# Patient Record
Sex: Male | Born: 1980
Health system: Southern US, Community
[De-identification: ages and names within clinical notes are randomized; demographics above are authoritative.]

## PROBLEM LIST (undated history)

## (undated) DIAGNOSIS — M064 Inflammatory polyarthropathy: Secondary | ICD-10-CM

## (undated) DIAGNOSIS — M109 Gout, unspecified: Secondary | ICD-10-CM

## (undated) DIAGNOSIS — E785 Hyperlipidemia, unspecified: Secondary | ICD-10-CM

## (undated) DIAGNOSIS — K219 Gastro-esophageal reflux disease without esophagitis: Secondary | ICD-10-CM

## (undated) HISTORY — PX: KNEE SURGERY: SHX244

## (undated) HISTORY — DX: Hyperlipidemia, unspecified: E78.5

## (undated) HISTORY — DX: Gastro-esophageal reflux disease without esophagitis: K21.9

## (undated) HISTORY — PX: VASECTOMY: SHX75

## (undated) HISTORY — DX: Inflammatory polyarthropathy: M06.4

## (undated) HISTORY — PX: TOE SURGERY: SHX1073

---

## 2006-04-19 ENCOUNTER — Ambulatory Visit (HOSPITAL_COMMUNITY): Admission: RE | Admit: 2006-04-19 | Discharge: 2006-04-19 | Payer: Self-pay | Admitting: Orthopedic Surgery

## 2006-05-17 ENCOUNTER — Ambulatory Visit (HOSPITAL_COMMUNITY): Admission: RE | Admit: 2006-05-17 | Discharge: 2006-05-17 | Payer: Self-pay | Admitting: Orthopedic Surgery

## 2006-09-26 ENCOUNTER — Ambulatory Visit: Payer: Self-pay | Admitting: Internal Medicine

## 2006-10-01 ENCOUNTER — Ambulatory Visit: Payer: Self-pay | Admitting: Internal Medicine

## 2006-10-01 LAB — CONVERTED CEMR LAB
Bilirubin Urine: NEGATIVE
CO2: 30 meq/L (ref 19–32)
Calcium: 9 mg/dL (ref 8.4–10.5)
Chloride: 106 meq/L (ref 96–112)
Cholesterol: 172 mg/dL (ref 0–200)
Crystals: NEGATIVE
Hemoglobin, Urine: NEGATIVE
Leukocytes, UA: NEGATIVE
Nitrite: NEGATIVE
RBC / HPF: NONE SEEN
Total Protein, Urine: NEGATIVE mg/dL
Triglyceride fasting, serum: 176 mg/dL — ABNORMAL HIGH (ref 0–149)
Urine Glucose: NEGATIVE mg/dL
Urobilinogen, UA: 0.2 (ref 0.0–1.0)
VLDL: 35 mg/dL (ref 0–40)
pH: 5.5 (ref 5.0–8.0)

## 2007-01-20 ENCOUNTER — Ambulatory Visit: Payer: Self-pay | Admitting: Internal Medicine

## 2007-01-21 ENCOUNTER — Encounter: Payer: Self-pay | Admitting: Internal Medicine

## 2007-01-31 ENCOUNTER — Ambulatory Visit: Payer: Self-pay | Admitting: Internal Medicine

## 2007-01-31 LAB — CONVERTED CEMR LAB
H Pylori IgG: NEGATIVE
Tissue Transglutaminase Ab, IgA: <3 units (ref <5)

## 2007-02-14 ENCOUNTER — Ambulatory Visit: Payer: Self-pay | Admitting: Internal Medicine

## 2007-02-27 ENCOUNTER — Ambulatory Visit: Payer: Self-pay | Admitting: Internal Medicine

## 2007-03-06 ENCOUNTER — Encounter: Payer: Self-pay | Admitting: Internal Medicine

## 2007-03-06 ENCOUNTER — Ambulatory Visit: Payer: Self-pay | Admitting: Internal Medicine

## 2007-03-25 ENCOUNTER — Ambulatory Visit: Payer: Self-pay | Admitting: Internal Medicine

## 2007-03-25 LAB — CONVERTED CEMR LAB
AST: 27 units/L (ref 0–37)
Basophils Absolute: 0 10*3/uL (ref 0.0–0.1)
Eosinophils Absolute: 0.1 10*3/uL (ref 0.0–0.6)
Eosinophils Relative: 0.7 % (ref 0.0–5.0)
HCT: 44 % (ref 39.0–52.0)
Lymphocytes Relative: 17.4 % (ref 12.0–46.0)
MCHC: 34.6 g/dL (ref 30.0–36.0)
Mono Screen: NEGATIVE
Neutrophils Relative %: 71.9 % (ref 43.0–77.0)
RBC: 5.42 M/uL (ref 4.22–5.81)
WBC: 11.7 10*3/uL — ABNORMAL HIGH (ref 4.5–10.5)

## 2007-06-06 DIAGNOSIS — K589 Irritable bowel syndrome without diarrhea: Secondary | ICD-10-CM | POA: Insufficient documentation

## 2007-06-06 DIAGNOSIS — J309 Allergic rhinitis, unspecified: Secondary | ICD-10-CM | POA: Insufficient documentation

## 2007-06-06 DIAGNOSIS — F988 Other specified behavioral and emotional disorders with onset usually occurring in childhood and adolescence: Secondary | ICD-10-CM | POA: Insufficient documentation

## 2007-06-06 DIAGNOSIS — K219 Gastro-esophageal reflux disease without esophagitis: Secondary | ICD-10-CM

## 2007-06-06 DIAGNOSIS — L0292 Furuncle, unspecified: Secondary | ICD-10-CM | POA: Insufficient documentation

## 2007-06-06 DIAGNOSIS — L2089 Other atopic dermatitis: Secondary | ICD-10-CM

## 2007-06-06 DIAGNOSIS — L0293 Carbuncle, unspecified: Secondary | ICD-10-CM

## 2007-06-06 DIAGNOSIS — F329 Major depressive disorder, single episode, unspecified: Secondary | ICD-10-CM

## 2009-12-29 ENCOUNTER — Ambulatory Visit (HOSPITAL_BASED_OUTPATIENT_CLINIC_OR_DEPARTMENT_OTHER): Admission: RE | Admit: 2009-12-29 | Discharge: 2009-12-29 | Payer: Self-pay | Admitting: Otolaryngology

## 2010-02-06 ENCOUNTER — Ambulatory Visit (HOSPITAL_BASED_OUTPATIENT_CLINIC_OR_DEPARTMENT_OTHER): Admission: RE | Admit: 2010-02-06 | Discharge: 2010-02-06 | Payer: Self-pay | Admitting: Otolaryngology

## 2010-02-11 ENCOUNTER — Ambulatory Visit: Payer: Self-pay | Admitting: Internal Medicine

## 2010-03-04 ENCOUNTER — Encounter: Payer: Self-pay | Admitting: Internal Medicine

## 2010-05-12 ENCOUNTER — Encounter: Payer: Self-pay | Admitting: Internal Medicine

## 2010-05-29 ENCOUNTER — Encounter: Payer: Self-pay | Admitting: Internal Medicine

## 2010-11-28 NOTE — Letter (Signed)
Summary: CMN for CPAP Supplies/[facility]  CMN for CPAP Supplies/[facility]   Imported By: Sherian Rein 03/10/2010 10:05:34  _____________________________________________________________________  External Attachment:    Type:   Image     Comment:   External Document

## 2010-11-28 NOTE — Letter (Signed)
Summary: CMN/[facility]  CMN/[facility]   Imported By: [place] 06/02/2010 14:24:12  _____________________________________________________________________  External Attachment:    Type:   Image     Comment:   External Document

## 2010-11-28 NOTE — Letter (Signed)
Summary: CMN for CPAP Supplies/[facility]  CMN for CPAP Supplies/[facility]   Imported By: Sherian Rein 05/17/2010 07:38:20  _____________________________________________________________________  External Attachment:    Type:   Image     Comment:   External Document

## 2011-03-13 NOTE — Assessment & Plan Note (Signed)
[facility]                         GASTROENTEROLOGY OFFICE NOTE   NAME:FUHRMANGagan, Dillion                      MRN:          [id number]  DATE:02/27/2007                            DOB:          14-Jan-1981    CHIEF COMPLAINT:  Dr. Artist Pais wants me to have an endoscopy to look for  sprue.   ASSESSMENT:  A 30 year old white man with chronic recurrent diarrhea and  crampy abdominal pain.  He has a lot of urgent defecation.  There may  have been some rectal bleeding.  He has a positive antigliadin antibody,  IgG.  This could indicate sprue.   PLAN:  1. Schedule upper GI endoscopy with duodenal biopsy.  2. Pending that, consider symptomatic treatment for irritable bowel      syndrome, which is certainly quite likely.  3. He could need a flexible sigmoidoscopy or other investigation as      well.  4. He also has complaints of gastroesophageal reflux disease, but he      is currently on ranitidine versus omeprazole, he cannot remember      which or both, and seems to be okay.  He will clarify which      medication he is taking.   HISTORY:  See my medical history form for full details.  Other notables  are that his weight and appetite seem to fluctuate.   CURRENT MEDICATIONS:  He is either on omeprazole 20 mg daily or  ranitidine 150 mg or both.  Wellbutrin XL 300 mg daily, Xyzal 5 mg at  bedtime, minocycline 50 mg twice daily.   DRUG ALLERGIES:  None known.   PAST MEDICAL HISTORY:  1. Acne vulgaris.  2. Left toe surgery.  3. Gastroesophageal reflux disease.  4. History of substance abuse as a teenager.  5. Depression.  6. Atopic dermatitis.  7. History of attention-deficit hyperactivity disorder.   SOCIAL HISTORY:  He is married.  Two children.  Moved here from [place]  a couple of years ago.  He is here with his mother-in-law today.  No  alcohol, tobacco, or drug use.   FAMILY HISTORY:  Noncontributory.  See medical history form.   REVIEW OF  SYSTEMS:  See my medical history form.   PHYSICAL EXAMINATION:  VITAL SIGNS:  Height 6 feet 1.  Weight 276  pounds.  Blood pressure 120/82, pulse 80.  GENERAL:  A well-developed, obese white male in no acute distress.  HEENT:  Eyes:  Anicteric.  ENT:  Normal mouth, pharynx.  NECK:  Supple.  No thyromegaly.  CHEST:  Clear.  HEART:  S1 and S2.  No murmurs, rubs or gallops.  ABDOMEN:  Obese, soft and nontender without organomegaly or mass.  SKIN:  He had some changes of acne on the face and torso.   I appreciate the opportunity to care for this patient.  I have reviewed  the records in the chart from Dr. Artist Pais.     Iva Boop, MD,FACG  Electronically Signed    CEG/MedQ  DD: 02/28/2007  DT: 02/28/2007  Job #: [id number]   cc:   Barbette Hair. Artist Pais,  DO

## 2011-03-16 NOTE — Assessment & Plan Note (Signed)
[facility]                           PRIMARY CARE OFFICE NOTE   NAME:FUHRMANEdras, Thomas Schaefer                      MRN:          [id number]  DATE:09/26/2006                            DOB:          05-07-1981    CHIEF COMPLAINT:  New patient to practice.   HISTORY OF PRESENT ILLNESS:  The patient is a 29 year old white male  here to establish primary care.  He was formerly followed by [facility], Dr. Jeannetta Nap.  The patient states that he has a  past medical history of ADHD as well as depression.  He was seeing a  psychiatrist in [place] before he moved to this area.  He does not  recall which medications he was on.  It may have been Wellbutrin.  He  states that he often forgot to take his medications.  He does not have  any issues with depression at this time.  He is married, works as a  Advice worker, has been functioning well.   His other history is significant for gastroesophageal reflux disease and  remote history of alcohol/recreational drug use.   CURRENT MEDICATIONS:  None.   ALLERGIES TO MEDICATIONS:  None known.   PAST MEDICAL HISTORY SUMMARY:  1. ADHD.  2. Depression.  3. History of alcohol abuse.  4. Gastroesophageal reflux disease.   SOCIAL HISTORY:  The patient is married.  He has a daughter that is 69  years old and another child on the way.  It has been approximately 2  years since he moved from [place].   FAMILY HISTORY:  Mother is alive at age 89, has type 2 diabetes.  She  has bipolar disorder.  Father is alive, approximately age 99, has  hypertension and hyperlipidemia.  Emelia Loron is known to have heart  disease at age 37.  Denies any family history of colon cancer or  prostate cancer.   HABITS:  No current alcohol or tobacco use.  Quit tobacco in 2005.  He  smoked for approximately 13 years, one pack per day.  When he was  drinking between the ages of 56 and 22 he states that he was drinking  daily,  mainly beer.  There is also a remote history of cannabis use and  acid use.   REVIEW OF SYSTEMS:  No fevers, chills.  The patient's wife has been  concerned regarding possible hearing loss.  He normally listens to the  TV at a high volume.  Also sometimes has difficulty discerning normal  conversation.  He denies any chest pain, shortness of breath.  Heartburn  is mainly with spicy foods.  Denies any constipation or diarrhea.  No  dark stools or blood in his stool.  All other systems negative.   PHYSICAL EXAMINATION:  VITAL SIGNS:  Height is 6 feet 4 inches, weight  is 272 pounds, temperature is 97.9, pulse is 64, and BP is 127/74 in the  left arm in a seated position.  GENERAL:  The patient is a well-developed, well-nourished, pleasant 21-  year-old white male in no apparent distress.  HEENT:  Normocephalic, atraumatic.  Pupils are equal and reactive to  light bilaterally.  Extraocular motility was intact.  The patient was  anicteric.  Conjunctivae were within normal limits.  The patient's  oropharynx was unremarkable.  External auditory canals and tympanic  membranes are clear bilaterally.  The patient had diminished hearing on  his right side to finger-rub and Rhine/Weber test was positive also on  the right.  NECK:  Supple.  No adenopathy, carotid bruit or thyromegaly.  CHEST:  Normal respiratory effort.  Chest was clear to auscultation  bilaterally.  No rhonchi, rales or wheezing.  CARDIOVASCULAR:  Regular rate and rhythm.  No significant murmurs, rubs  or gallops appreciated.  ABDOMEN:  Soft, nontender, positive bowel sounds, no organomegaly.  MUSCULOSKELETAL:  No clubbing, cyanosis, or edema.  SKIN:  Warm and dry.  He did have some cystic acne on the face and back.  NEUROLOGIC:  Cranial nerves II-XII were grossly intact.  He was  nonfocal.   IMPRESSION/RECOMMENDATIONS:  1. Hearing loss, conductive versus sensorineural.  2. History of attention deficit hyperactivity  disorder.  3. History of depression.  4. Health maintenance.   RECOMMENDATIONS:  He does not have any etiology from his employment in  terms of exposure to loud noises, but he does seem to have gross hearing  loss, particularly on the right side.  He will be referred to an  audiologist for further testing.   We will try to obtain records from his psychiatrist in [place].  At this  time the patient seems to be highly functional.  We will further discuss  whether we need to restart antidepressants on followup visit.   With family history of coronary artery disease, the patient will be sent  for routine labs within the next 1-2 week time period and a fasting  blood sugar will also be checked.  Followup time is in approximately 6  weeks.     Barbette Hair. Artist Pais, DO  Electronically Signed    RDY/MedQ  DD: 09/27/2006  DT: 09/27/2006  Job #: [id number]

## 2011-06-26 ENCOUNTER — Emergency Department (HOSPITAL_COMMUNITY)
Admission: EM | Admit: 2011-06-26 | Discharge: 2011-06-26 | Disposition: A | Discharge status: Home / Self Care | Payer: Medicaid Other | Attending: Emergency Medicine | Admitting: Emergency Medicine

## 2011-06-26 ENCOUNTER — Emergency Department (HOSPITAL_COMMUNITY): Payer: Medicaid Other

## 2011-06-26 DIAGNOSIS — S838X9A Sprain of other specified parts of unspecified knee, initial encounter: Secondary | ICD-10-CM | POA: Insufficient documentation

## 2011-06-26 DIAGNOSIS — M25569 Pain in unspecified knee: Secondary | ICD-10-CM | POA: Insufficient documentation

## 2011-06-26 DIAGNOSIS — W010XXA Fall on same level from slipping, tripping and stumbling without subsequent striking against object, initial encounter: Secondary | ICD-10-CM | POA: Insufficient documentation

## 2011-06-26 DIAGNOSIS — S86819A Strain of other muscle(s) and tendon(s) at lower leg level, unspecified leg, initial encounter: Secondary | ICD-10-CM | POA: Insufficient documentation

## 2012-08-13 ENCOUNTER — Ambulatory Visit
Admission: RE | Admit: 2012-08-13 | Discharge: 2012-08-13 | Disposition: A | Discharge status: Home / Self Care | Payer: BC Managed Care – PPO | Source: Ambulatory Visit | Attending: Chiropractor | Admitting: Chiropractor

## 2012-08-13 ENCOUNTER — Other Ambulatory Visit: Payer: Self-pay | Admitting: Chiropractor

## 2012-08-13 DIAGNOSIS — M545 Low back pain: Secondary | ICD-10-CM

## 2015-06-24 ENCOUNTER — Ambulatory Visit: Payer: Self-pay | Admitting: Internal Medicine

## 2015-07-11 ENCOUNTER — Ambulatory Visit (INDEPENDENT_AMBULATORY_CARE_PROVIDER_SITE_OTHER): Payer: Commercial Managed Care - HMO | Admitting: Internal Medicine

## 2015-07-11 ENCOUNTER — Other Ambulatory Visit (INDEPENDENT_AMBULATORY_CARE_PROVIDER_SITE_OTHER): Payer: Commercial Managed Care - HMO

## 2015-07-11 ENCOUNTER — Encounter: Payer: Self-pay | Admitting: Internal Medicine

## 2015-07-11 VITALS — BP 110/62 | HR 68 | Temp 98.4°F | Resp 16 | Ht 73.0 in | Wt 297.0 lb

## 2015-07-11 DIAGNOSIS — K589 Irritable bowel syndrome without diarrhea: Secondary | ICD-10-CM

## 2015-07-11 DIAGNOSIS — E785 Hyperlipidemia, unspecified: Secondary | ICD-10-CM | POA: Diagnosis not present

## 2015-07-11 DIAGNOSIS — F39 Unspecified mood [affective] disorder: Secondary | ICD-10-CM

## 2015-07-11 DIAGNOSIS — E669 Obesity, unspecified: Secondary | ICD-10-CM

## 2015-07-11 LAB — COMPREHENSIVE METABOLIC PANEL
ALK PHOS: 64 U/L (ref 39–117)
ALT: 32 U/L (ref 0–53)
AST: 26 U/L (ref 0–37)
Albumin: 4 g/dL (ref 3.5–5.2)
BUN: 9 mg/dL (ref 6–23)
CHLORIDE: 104 meq/L (ref 96–112)
CO2: 29 mEq/L (ref 19–32)
Calcium: 9.1 mg/dL (ref 8.4–10.5)
Creatinine, Ser: 0.99 mg/dL (ref 0.40–1.50)
GFR: 91.92 mL/min (ref >60.00)
Glucose, Bld: 83 mg/dL (ref 70–99)
POTASSIUM: 4 meq/L (ref 3.5–5.1)
Sodium: 140 mEq/L (ref 135–145)
Total Bilirubin: 0.7 mg/dL (ref 0.2–1.2)
Total Protein: 6.9 g/dL (ref 6.0–8.3)

## 2015-07-11 LAB — LIPID PANEL
CHOLESTEROL: 184 mg/dL (ref 0–200)
HDL: 31.4 mg/dL — ABNORMAL LOW (ref >39.00)
LDL CALC: 117 mg/dL — AB (ref 0–99)
NonHDL: 152.77
TRIGLYCERIDES: 179 mg/dL — AB (ref 0.0–149.0)
Total CHOL/HDL Ratio: 6
VLDL: 35.8 mg/dL (ref 0.0–40.0)

## 2015-07-11 LAB — CBC
HEMATOCRIT: 44.1 % (ref 39.0–52.0)
HEMOGLOBIN: 15.1 g/dL (ref 13.0–17.0)
MCHC: 34.3 g/dL (ref 30.0–36.0)
MCV: 83.3 fl (ref 78.0–100.0)
Platelets: 218 10*3/uL (ref 150.0–400.0)
RBC: 5.3 Mil/uL (ref 4.22–5.81)
RDW: 13 % (ref 11.5–15.5)
WBC: 7 10*3/uL (ref 4.0–10.5)

## 2015-07-11 LAB — TSH: TSH: 2.04 u[IU]/mL (ref 0.35–4.50)

## 2015-07-11 MED ORDER — LOPERAMIDE HCL 2 MG PO TABS: 2.0000 mg | ORAL_TABLET | Freq: Four times a day (QID) | ORAL | Status: DC | PRN

## 2015-07-11 MED ORDER — PHENTERMINE-TOPIRAMATE ER 7.5-46 MG PO CP24: 1.0000 | ORAL_CAPSULE | Freq: Every day | ORAL | Status: DC

## 2015-07-11 MED ORDER — PHENTERMINE-TOPIRAMATE ER 3.75-23 MG PO CP24: 1.0000 | ORAL_CAPSULE | Freq: Every day | ORAL | Status: DC

## 2015-07-11 NOTE — Progress Notes (Signed)
Pre visit review using our clinic review tool, if applicable. No additional management support is needed unless otherwise documented below in the visit note. 

## 2015-07-11 NOTE — Patient Instructions (Addendum)
We have given you the prescriptions for the weight loss medicine. Take 1 pill for 2 weeks of the phentermine/topirimate 3.75/23 mg. Then if you are doing okay increase to the 7.5/46 mg pills (1 pill daily) for 2 weeks. If you are doing well call us and we will send in the next dose which you can stay on for 3 months to see if it works for weight loss. We need to see you back in about 3 months to see how the weight is doing.  For the stomach we would like you to work on increasing fiber in your diet or taking fiber supplement to help with the bowels. You can use imodium to help prevent diarrhea after eating.   For counseling we recommend: [facility] Address: [address], [place], [place] [address] Phone: [phone] or [facility] counseling [address] [place], [place] [address] ph: [phone]  We are checking your labs today and will call you back with the results.   Diet and Irritable Bowel Syndrome  No cure has been found for irritable bowel syndrome (IBS). Many options are available to treat the symptoms. Your caregiver will give you the best treatments available for your symptoms. He or she will also encourage you to manage stress and to make changes to your diet. You need to work with your caregiver and Registered Dietician to find the best combination of medicine, diet, counseling, and support to control your symptoms. The following are some diet suggestions. FOODS THAT MAKE IBS WORSE  Fatty foods, such as [place] fries.  Milk products, such as cheese or ice cream.  Chocolate.  Alcohol.  Caffeine (found in coffee and some sodas).  Carbonated drinks, such as soda. If certain foods cause symptoms, you should eat less of them or stop eating them. FOOD JOURNAL   Keep a journal of the foods that seem to cause distress. Write down:  What you are eating during the day and when.  What problems you are having after eating.  When the symptoms occur in relation to your  meals.  What foods always make you feel badly.  Take your notes with you to your caregiver to see if you should stop eating certain foods. FOODS THAT MAKE IBS BETTER Fiber reduces IBS symptoms, especially constipation, because it makes stools soft, bulky, and easier to pass. Fiber is found in bran, bread, cereal, beans, fruit, and vegetables. Examples of foods with fiber include:  Apples.  Peaches.  Pears.  Berries.  Figs.  Broccoli, raw.  Cabbage.  Carrots.  Raw peas.  Kidney beans.  Lima beans.  Whole-grain bread.  Whole-grain cereal. Add foods with fiber to your diet a little at a time. This will let your body get used to them. Too much fiber at once might cause gas and swelling of your abdomen. This can trigger symptoms in a person with IBS. Caregivers usually recommend a diet with enough fiber to produce soft, painless bowel movements. High fiber diets may cause gas and bloating. However, these symptoms often go away within a few weeks, as your body adjusts. In many cases, dietary fiber may lessen IBS symptoms, particularly constipation. However, it may not help pain or diarrhea. High fiber diets keep the colon mildly enlarged (distended) with the added fiber. This may help prevent spasms in the colon. Some forms of fiber also keep water in the stool, thereby preventing hard stools that are difficult to pass.  Besides telling you to eat more foods  with fiber, your caregiver may also tell you to get more fiber by taking a fiber pill or drinking water mixed with a special high fiber powder. An example of this is a natural fiber laxative containing psyllium seed.  TIPS  Large meals can cause cramping and diarrhea in people with IBS. If this happens to you, try eating 4 or 5 small meals a day, or try eating less at each of your usual 3 meals. It may also help if your meals are low in fat and high in carbohydrates. Examples of carbohydrates are pasta, rice, whole-grain breads  and cereals, fruits, and vegetables.  If dairy products cause your symptoms to flare up, you can try eating less of those foods. You might be able to handle yogurt better than other dairy products, because it contains bacteria that helps with digestion. Dairy products are an important source of calcium and other nutrients. If you need to avoid dairy products, be sure to talk with a Registered Dietitian about getting these nutrients through other food sources.  Drink enough water and fluids to keep your urine clear or pale yellow. This is important, especially if you have diarrhea. FOR MORE INFORMATION  International Foundation for Functional Gastrointestinal Disorders: www.iffgd.org  National Digestive Diseases Information Clearinghouse: digestive.StageSync.si Document Released: 01/05/2004 Document Revised: 01/07/2012 Document Reviewed: 01/15/2014 [facility] Patient Information 2015 [place], [place]. This information is not intended to replace advice given to you by your health care provider. Make sure you discuss any questions you have with your health care provider.

## 2015-07-12 ENCOUNTER — Telehealth: Payer: Self-pay | Admitting: *Deleted

## 2015-07-12 NOTE — Telephone Encounter (Signed)
Receive call from pharmacy [place] state they can not get the strength for the phentermine that she rx. Only [facility] carry those strength, but if md ok with changing we have the regular phentermine 15 mg, 37.5. Pls advise...Raechel Chute

## 2015-07-12 NOTE — Telephone Encounter (Signed)
Notified pharmacy with md response.../lmb 

## 2015-07-12 NOTE — Telephone Encounter (Signed)
No, not okay with the regular phentermine. It should be a combo pill with topiramate. Have them try [facility]

## 2015-07-13 ENCOUNTER — Encounter: Payer: Self-pay | Admitting: Internal Medicine

## 2015-07-13 DIAGNOSIS — F39 Unspecified mood [affective] disorder: Secondary | ICD-10-CM | POA: Insufficient documentation

## 2015-07-13 DIAGNOSIS — E669 Obesity, unspecified: Secondary | ICD-10-CM | POA: Insufficient documentation

## 2015-07-13 NOTE — Assessment & Plan Note (Signed)
Would not go back on phentermine alone but can try the phentermine-topirimate. Checking labs for complications of his weight. BP okay on exam today.

## 2015-07-13 NOTE — Progress Notes (Signed)
   Subjective:    Patient ID: Thomas Schaefer, male    DOB: 11-Feb-1981, 34 y.o.   MRN: [id number]  HPI The patient is a 34 YO man who is new with several concerns. He does have some bowel troubles and has diarrhea after eating about 30 minutes afterwards. Described as foul smelling. Seen GI in the past and was lost to follow up. They did EGD in the past without findings. Has not tried anything for it including dietary changes. No weight change and no night time bowel movements.  Next concern is his weight. Was on phentermine for some time and wonders if he can try that again. 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 Has not tried dieting.   Review of Systems  Constitutional: Negative for fever, chills, activity change, appetite change, fatigue and unexpected weight change.  HENT: Negative.   Eyes: Negative.   Respiratory: Negative for cough, chest tightness, shortness of breath and wheezing.   Cardiovascular: Negative for chest pain, palpitations and leg swelling.  Gastrointestinal: Positive for diarrhea and abdominal distention. Negative for nausea, vomiting, abdominal pain, constipation and blood in stool.  Musculoskeletal: Negative.   Neurological: Negative.   Psychiatric/Behavioral: Negative.       Objective:   Physical Exam  Constitutional: He is oriented to person, place, and time. He appears well-developed and well-nourished.  Oveweight  HENT:  Head: Normocephalic and atraumatic.  Right Ear: External ear normal.  Left Ear: External ear normal.  Eyes: EOM are normal.  Neck: Normal range of motion.  Cardiovascular: Normal rate and regular rhythm.   No murmur heard. Pulmonary/Chest: Effort normal and breath sounds normal. No respiratory distress. He has no wheezes. He has no rales.  Abdominal: Soft. Bowel sounds are normal. He exhibits no distension. There is no tenderness. There is no rebound.  Musculoskeletal: He exhibits no edema.  Neurological: He is  alert and oriented to person, place, and time. Coordination normal.  Skin: Skin is warm and dry.  Psychiatric: He has a normal mood and affect.   Filed Vitals:   07/11/15 1106  BP: 110/62  Pulse: 68  Temp: 98.4 F (36.9 C)  TempSrc: Oral  Resp: 16  Height:  (1.854 m)  Weight: 297 lb (134.718 kg)  SpO2: 97%      Assessment & Plan:

## 2015-07-13 NOTE — Assessment & Plan Note (Signed)
He does have some problems with anger and losing jobs as well as many adjustments right now. Recommendations for counseling as a first step. If he is still struggling or having more problems would recommend psych referral as his is not simple depression or anxiety.

## 2015-07-13 NOTE — Assessment & Plan Note (Signed)
Would be most likely diagnosis. Talked to him about fiber and imodium as ways to combat his post-meal reflex to move his bowels. If no improvement can see GI again.

## 2015-07-20 ENCOUNTER — Telehealth: Payer: Self-pay

## 2015-07-20 NOTE — Telephone Encounter (Signed)
Started PA via fax request from [employer].

## 2015-10-11 ENCOUNTER — Ambulatory Visit: Payer: Commercial Managed Care - HMO | Admitting: Internal Medicine

## 2016-01-27 ENCOUNTER — Ambulatory Visit: Payer: Commercial Managed Care - HMO | Admitting: Internal Medicine

## 2017-01-01 ENCOUNTER — Telehealth: Payer: Self-pay | Admitting: Internal Medicine

## 2017-01-01 DIAGNOSIS — H9325 Central auditory processing disorder: Secondary | ICD-10-CM

## 2017-01-01 NOTE — Telephone Encounter (Signed)
Wife called In and said that pt is need a referral for a auto processing testing   To [facility]  [address]   Phone number [phone]

## 2017-01-01 NOTE — Telephone Encounter (Signed)
I'm not sure what this is but likely he does not need referral.

## 2017-01-02 NOTE — Telephone Encounter (Signed)
Referral placed. May need OV.

## 2017-01-02 NOTE — Telephone Encounter (Signed)
Patient is wanting a referral for auditory processing evaluation.  Wants to see Thomas Schaefer, they called and the place said they need a referral put in. Please advise

## 2017-01-02 NOTE — Telephone Encounter (Signed)
Patient contacted and stated awareness 

## 2017-03-20 ENCOUNTER — Ambulatory Visit: Payer: Commercial Managed Care - HMO | Admitting: Audiology

## 2017-05-16 ENCOUNTER — Ambulatory Visit: Payer: Commercial Managed Care - HMO | Admitting: Audiology

## 2017-06-24 ENCOUNTER — Encounter (HOSPITAL_BASED_OUTPATIENT_CLINIC_OR_DEPARTMENT_OTHER): Payer: Self-pay | Admitting: Emergency Medicine

## 2017-06-24 ENCOUNTER — Emergency Department (HOSPITAL_BASED_OUTPATIENT_CLINIC_OR_DEPARTMENT_OTHER)
Admission: EM | Admit: 2017-06-24 | Discharge: 2017-06-24 | Disposition: A | Discharge status: Home / Self Care | Payer: Worker's Compensation | Attending: Emergency Medicine | Admitting: Emergency Medicine

## 2017-06-24 DIAGNOSIS — T679XXA Effect of heat and light, unspecified, initial encounter: Secondary | ICD-10-CM | POA: Insufficient documentation

## 2017-06-24 DIAGNOSIS — E860 Dehydration: Secondary | ICD-10-CM | POA: Diagnosis not present

## 2017-06-24 DIAGNOSIS — Z87891 Personal history of nicotine dependence: Secondary | ICD-10-CM | POA: Insufficient documentation

## 2017-06-24 DIAGNOSIS — Z79899 Other long term (current) drug therapy: Secondary | ICD-10-CM | POA: Diagnosis not present

## 2017-06-24 DIAGNOSIS — N179 Acute kidney failure, unspecified: Secondary | ICD-10-CM

## 2017-06-24 DIAGNOSIS — R55 Syncope and collapse: Secondary | ICD-10-CM | POA: Diagnosis present

## 2017-06-24 DIAGNOSIS — R42 Dizziness and giddiness: Secondary | ICD-10-CM | POA: Diagnosis not present

## 2017-06-24 LAB — BASIC METABOLIC PANEL
Anion gap: 7 (ref 5–15)
BUN: 11 mg/dL (ref 6–20)
CO2: 27 mmol/L (ref 22–32)
CREATININE: 1.31 mg/dL — AB (ref 0.61–1.24)
Calcium: 9 mg/dL (ref 8.9–10.3)
Chloride: 102 mmol/L (ref 101–111)
GFR calc Af Amer: >60 mL/min (ref >60)
GLUCOSE: 97 mg/dL (ref 65–99)
POTASSIUM: 4.1 mmol/L (ref 3.5–5.1)
SODIUM: 136 mmol/L (ref 135–145)

## 2017-06-24 LAB — CBC WITH DIFFERENTIAL/PLATELET
Basophils Absolute: 0 10*3/uL (ref 0.0–0.1)
Basophils Relative: 1 %
EOS ABS: 0.2 10*3/uL (ref 0.0–0.7)
EOS PCT: 3 %
HCT: 41.9 % (ref 39.0–52.0)
Hemoglobin: 14.7 g/dL (ref 13.0–17.0)
LYMPHS ABS: 1.1 10*3/uL (ref 0.7–4.0)
LYMPHS PCT: 18 %
MCH: 28.7 pg (ref 26.0–34.0)
MCHC: 35.1 g/dL (ref 30.0–36.0)
MCV: 81.7 fL (ref 78.0–100.0)
MONO ABS: 0.5 10*3/uL (ref 0.1–1.0)
Monocytes Relative: 9 %
Neutro Abs: 4.1 10*3/uL (ref 1.7–7.7)
Neutrophils Relative %: 71 %
PLATELETS: 193 10*3/uL (ref 150–400)
RBC: 5.13 MIL/uL (ref 4.22–5.81)
RDW: 12.9 % (ref 11.5–15.5)
WBC: 5.9 10*3/uL (ref 4.0–10.5)

## 2017-06-24 LAB — CBG MONITORING, ED: GLUCOSE-CAPILLARY: 91 mg/dL (ref 65–99)

## 2017-06-24 LAB — CK: Total CK: 272 U/L (ref 49–397)

## 2017-06-24 MED ORDER — SODIUM CHLORIDE 0.9 % IV BOLUS (SEPSIS)
1000.0000 mL | Freq: Once | INTRAVENOUS | Status: AC
  Administered 2017-06-24: 1000 mL via INTRAVENOUS

## 2017-06-24 NOTE — ED Triage Notes (Addendum)
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  Pt alert and oriented x 4, diaphoretic, IV in place per EMS, pt was given 500cc NS bolus on truck.

## 2017-06-24 NOTE — Discharge Instructions (Signed)
Get help right away if: You develop symptoms of worsening kidney disease, which include: Headaches. Abnormally dark or light skin. Easy bruising. Frequent hiccups. Chest pain. Shortness of breath. End of menstruation in women. Seizures. Confusion or altered mental status. Abdominal or back pain. Itchiness. You have a fever. Your body is producing less urine. You have pain or bleeding when you urina

## 2017-06-24 NOTE — ED Provider Notes (Signed)
[facility] Provider Note   CSN: [id number] Arrival date & time: 06/24/17  1148     History   Chief Complaint Chief Complaint  Patient presents with  . Near Syncope    HPI Thomas Schaefer is a 36 y.o. male who presents emergency by with chief complaint of near syncope. Patient is a IT sales professional and was complaining that this morning in full gear in rescue stimulation. Patient states that yesterday he noted for long-standing sweated heavily. He did not drink much. Today, he was crawling on his hands and knees and full gear with ambient air tanks during a simulated rescue. He states that it was very hot. He felt extremely winded and tired and felt like he was going to pass out. He denies any racing or skipping in his heart, melena, hematochezia. Patient states that he was overheated, but felt improved after he got out of his toes and had cold water to drink.  HPI  Past Medical History:  Diagnosis Date  . GERD (gastroesophageal reflux disease)   . Hyperlipidemia     Patient Active Problem List   Diagnosis Date Noted  . Obesity 07/13/2015  . Mood disorder (HCC) 07/13/2015  . ALLERGIC RHINITIS 06/06/2007  . GERD 06/06/2007  . IRRITABLE BOWEL SYNDROME 06/06/2007    Past Surgical History:  Procedure Laterality Date  . VASECTOMY         Home Medications    Prior to Admission medications   Medication Sig Start Date End Date Taking? Authorizing Provider  EPIPEN 2-PAK 0.3 MG/0.3ML SOAJ injection  04/20/15   [provider]  Phentermine-Topiramate 3.75-23 MG CP24 Take 1 tablet by mouth daily. 07/11/15   Myrlene Broker, MD    Family History Family History  Problem Relation Age of Onset  . Mental illness Mother   . Drug abuse Mother   . Alcohol abuse Father   . Alcohol abuse Maternal Uncle     Social History Social History  Substance Use Topics  . Smoking status: Former Games developer  . Smokeless tobacco: Never Used  . Alcohol use No      Allergies   Bee venom   Review of Systems Review of Systems Ten systems reviewed and are negative for acute change, except as noted in the HPI.    Physical Exam Updated Vital Signs BP 125/82 (BP Location: Left Arm)   Pulse 88   Temp 98.4 F (36.9 C) (Oral)   Resp 18   Ht 6\' 1"  (1.854 m)   Wt 133.8 kg (295 lb)   SpO2 100%   BMI 38.92 kg/m   Physical Exam  Constitutional: He is oriented to person, place, and time. He appears well-developed and well-nourished. No distress.  HENT:  Head: Normocephalic and atraumatic.  Eyes: Conjunctivae are normal. No scleral icterus.  Neck: Normal range of motion. Neck supple.  Cardiovascular: Normal rate, regular rhythm and normal heart sounds.   Pulmonary/Chest: Effort normal and breath sounds normal. No respiratory distress.  Abdominal: Soft. He exhibits no distension. There is no tenderness.  Musculoskeletal: Normal range of motion. He exhibits no edema.  Neurological: He is alert and oriented to person, place, and time.  Skin: Skin is warm and dry. He is not diaphoretic.  Psychiatric: His behavior is normal.  Nursing note and vitals reviewed.    ED Treatments / Results  Labs (all labs ordered are listed, but only abnormal results are displayed) Labs Reviewed  BASIC METABOLIC PANEL - Abnormal; Notable for the following:  Result Value   Creatinine, Ser 1.31 (*)    All other components within normal limits  CK  CBC WITH DIFFERENTIAL/PLATELET  CBG MONITORING, ED    EKG  EKG Interpretation None       Radiology No results found.  Procedures Procedures (including critical care time)  Medications Ordered in ED Medications  sodium chloride 0.9 % bolus 1,000 mL (not administered)     Initial Impression / Assessment and Plan / ED Course  I have reviewed the triage vital signs and the nursing notes.  Pertinent labs & imaging results that were available during my care of the patient were reviewed by me and  considered in my medical decision making (see chart for details).     Patient blood pressure dropped from 125-108. He seems to have some dehydration with a bump in his creatinine. Patient given 2 L of fluid, CK, not elevated to suggest heat injury or rhabdomyolysis. EKG without significant abnormality. Patient has PCP follow-up and is advised to have his creatinine rechecked. He appears safe for discharge at this time.  Final Clinical Impressions(s) / ED Diagnoses   Final diagnoses:  Dehydration  AKI (acute kidney injury) (HCC)  Postural dizziness with near syncope  Heat effect, initial encounter    New Prescriptions New Prescriptions   No medications on file     Arthor Captain, PA-C 06/24/17 1657    Tilden Fossa, MD 06/26/17 [phone]

## 2017-06-25 ENCOUNTER — Encounter: Payer: Self-pay | Admitting: Nurse Practitioner

## 2017-06-25 ENCOUNTER — Ambulatory Visit (INDEPENDENT_AMBULATORY_CARE_PROVIDER_SITE_OTHER): Payer: Worker's Compensation | Admitting: Nurse Practitioner

## 2017-06-25 VITALS — BP 118/80 | HR 53 | Temp 97.9°F | Ht 73.0 in | Wt 303.0 lb

## 2017-06-25 DIAGNOSIS — E860 Dehydration: Secondary | ICD-10-CM | POA: Diagnosis not present

## 2017-06-25 DIAGNOSIS — T6701XA Heatstroke and sunstroke, initial encounter: Secondary | ICD-10-CM

## 2017-06-25 DIAGNOSIS — T670XXA Heatstroke and sunstroke, initial encounter: Secondary | ICD-10-CM | POA: Diagnosis not present

## 2017-06-25 NOTE — Progress Notes (Signed)
   Subjective:  Patient ID: Thomas Schaefer, male    DOB: 1981-08-25  Age: 36 y.o. MRN: [id number]  CC: Hospitalization Follow-up (hospital follow up from kidney problem. 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 HPI Present for re eval of lightheadedness and slurred speech. Onset yesterday at work (training session). Taken to ED for evaluation. Diagnosed with heat stroke and acute kidney injury. Administered 2L of saline. Today he reports complete resolution of symptoms.  Outpatient Medications Prior to Visit  Medication Sig Dispense Refill  . EPIPEN 2-PAK 0.3 MG/0.3ML SOAJ injection     . Phentermine-Topiramate 3.75-23 MG CP24 Take 1 tablet by mouth daily. (Patient not taking: Reported on 06/25/2017) 14 capsule 0   No facility-administered medications prior to visit.     ROS Review of Systems  Constitutional: Negative.   Respiratory: Negative.   Cardiovascular: Negative.   Gastrointestinal: Negative.   Genitourinary: Negative.   Musculoskeletal: Negative.   Skin: Negative.   Neurological: Negative for dizziness, focal weakness, loss of consciousness and headaches.    Objective:  BP 118/80   Pulse (!) 53   Temp 97.9 F (36.6 C)   Ht 6\' 1"  (1.854 m)   Wt (!) 303 lb (137.4 kg)   SpO2 99%   BMI 39.98 kg/m   BP Readings from Last 3 Encounters:  06/25/17 118/80  06/24/17 119/65  07/11/15 110/62    Wt Readings from Last 3 Encounters:  06/25/17 (!) 303 lb (137.4 kg)  06/24/17 295 lb (133.8 kg)  07/11/15 297 lb (134.7 kg)    Physical Exam  Constitutional: He is oriented to person, place, and time.  HENT:  Right Ear: External ear normal.  Left Ear: External ear normal.  Nose: Nose normal.  Mouth/Throat: Oropharynx is clear and moist. No oropharyngeal exudate.  Cardiovascular: Normal rate and regular rhythm.   Pulmonary/Chest: Effort normal.  Neurological: He is alert and oriented to person,  place, and time.  Skin: Skin is warm and dry.  Psychiatric: He has a normal mood and affect. His behavior is normal. Thought content normal.  Vitals reviewed.   Lab Results  Component Value Date   WBC 5.9 06/24/2017   HGB 14.7 06/24/2017   HCT 41.9 06/24/2017   PLT 193 06/24/2017   GLUCOSE 97 06/24/2017   CHOL 184 07/11/2015   TRIG 179.0 (H) 07/11/2015   HDL 31.40 (L) 07/11/2015   LDLCALC 117 (H) 07/11/2015   ALT 32 07/11/2015   AST 26 07/11/2015   NA 136 06/24/2017   K 4.1 06/24/2017   CL 102 06/24/2017   CREATININE 1.31 (H) 06/24/2017   BUN 11 06/24/2017   CO2 27 06/24/2017   TSH 2.04 07/11/2015    Assessment & Plan:   Antonie was seen today for hospitalization follow-up.  Diagnoses and all orders for this visit:  Dehydration -     Basic metabolic panel; Future  Heat stroke and sunstroke, initial encounter   I am having Mr. Altland maintain his EPIPEN 2-PAK and Phentermine-Topiramate.  No orders of the defined types were placed in this encounter.   Follow-up: Return if symptoms worsen or fail to improve.  Alysia Penna, NP

## 2017-06-25 NOTE — Patient Instructions (Addendum)
Return to office on Friday for repeat BMP.   HY IS IT IMPORTANT TO TREAT THIS CONDITION? It is important to take care of yourself and treat heat exhaustion as soon as possible. Untreated heat exhaustion can turn into heat stroke, which is a life-threatening condition that requires urgent medical treatment. HOW CAN I PREVENT THIS CONDITION? To prevent this condition:  Drink enough fluid to keep your urine clear or pale yellow. This helps your body to sweat properly.  Avoid outdoor activities on very hot or humid days.  Do not exercise or do other physical activity when you are not feeling well.  Take breaks often during physical activity.  Wear light-colored, loose-fitting, and lightweight clothing when it is hot outside.  Wear a hat and use sunscreen when exercising outdoors.  Avoid being outside during the hottest times of the day.  Check with your health care provider before you start any new activity, especially if you take medicine or have a medical condition.  Start any new activity slowly and work up to your fitness level.   Dehydration, Adult Dehydration is a condition in which there is not enough fluid or water in the body. This happens when you lose more fluids than you take in. Important organs, such as the kidneys, brain, and heart, cannot function without a proper amount of fluids. Any loss of fluids from the body can lead to dehydration. Dehydration can range from mild to severe. This condition should be treated right away to prevent it from becoming severe. What are the causes? This condition may be caused by:  Vomiting.  Diarrhea.  Excessive sweating, such as from heat exposure or exercise.  Not drinking enough fluid, especially: ? When ill. ? While doing activity that requires a lot of energy.  Excessive urination.  Fever.  Infection.  Certain medicines, such as medicines that cause the body to lose excess fluid (diuretics).  Inability to access safe  drinking water.  Reduced physical ability to get adequate water and food.  What increases the risk? This condition is more likely to develop in people:  Who have a poorly controlled long-term (chronic) illness, such as diabetes, heart disease, or kidney disease.  Who are age 58 or older.  Who are disabled.  Who live in a place with high altitude.  Who play endurance sports.  What are the signs or symptoms? Symptoms of mild dehydration may include:  Thirst.  Dry lips.  Slightly dry mouth.  Dry, warm skin.  Dizziness. Symptoms of moderate dehydration may include:  Very dry mouth.  Muscle cramps.  Dark urine. Urine may be the color of tea.  Decreased urine production.  Decreased tear production.  Heartbeat that is irregular or faster than normal (palpitations).  Headache.  Light-headedness, especially when you stand up from a sitting position.  Fainting (syncope). Symptoms of severe dehydration may include:  Changes in skin, such as: ? Cold and clammy skin. ? Blotchy (mottled) or pale skin. ? Skin that does not quickly return to normal after being lightly pinched and released (poor skin turgor).  Changes in body fluids, such as: ? Extreme thirst. ? No tear production. ? Inability to sweat when body temperature is high, such as in hot weather. ? Very little urine production.  Changes in vital signs, such as: ? Weak pulse. ? Pulse that is more than 100 beats a minute when sitting still. ? Rapid breathing. ? Low blood pressure.  Other changes, such as: ? Sunken eyes. ? Cold hands  and feet. ? Confusion. ? Lack of energy (lethargy). ? Difficulty waking up from sleep. ? Short-term weight loss. ? Unconsciousness. How is this diagnosed? This condition is diagnosed based on your symptoms and a physical exam. Blood and urine tests may be done to help confirm the diagnosis. How is this treated? Treatment for this condition depends on the severity. Mild  or moderate dehydration can often be treated at home. Treatment should be started right away. Do not wait until dehydration becomes severe. Severe dehydration is an emergency and it needs to be treated in a hospital. Treatment for mild dehydration may include:  Drinking more fluids.  Replacing salts and minerals in your blood (electrolytes) that you may have lost. Treatment for moderate dehydration may include:  Drinking an oral rehydration solution (ORS). This is a drink that helps you replace fluids and electrolytes (rehydrate). It can be found at pharmacies and retail stores. Treatment for severe dehydration may include:  Receiving fluids through an IV tube.  Receiving an electrolyte solution through a feeding tube that is passed through your nose and into your stomach (nasogastric tube, or NG tube).  Correcting any abnormalities in electrolytes.  Treating the underlying cause of dehydration. Follow these instructions at home:  If directed by your health care provider, drink an ORS: ? Make an ORS by following instructions on the package. ? Start by drinking small amounts, about  cup (120 mL) every 5-10 minutes. ? Slowly increase how much you drink until you have taken the amount recommended by your health care provider.  Drink enough clear fluid to keep your urine clear or pale yellow. If you were told to drink an ORS, finish the ORS first, then start slowly drinking other clear fluids. Drink fluids such as: ? Water. Do not drink only water. Doing that can lead to having too little salt (sodium) in the body (hyponatremia). ? Ice chips. ? Fruit juice that you have added water to (diluted fruit juice). ? Low-calorie sports drinks.  Avoid: ? Alcohol. ? Drinks that contain a lot of sugar. These include high-calorie sports drinks, fruit juice that is not diluted, and soda. ? Caffeine. ? Foods that are greasy or contain a lot of fat or sugar.  Take over-the-counter and prescription  medicines only as told by your health care provider.  Do not take sodium tablets. This can lead to having too much sodium in the body (hypernatremia).  Eat foods that contain a healthy balance of electrolytes, such as bananas, oranges, potatoes, tomatoes, and spinach.  Keep all follow-up visits as told by your health care provider. This is important. Contact a health care provider if:  You have abdominal pain that: ? Gets worse. ? Stays in one area (localizes).  You have a rash.  You have a stiff neck.  You are more irritable than usual.  You are sleepier or more difficult to wake up than usual.  You feel weak or dizzy.  You feel very thirsty.  You have urinated only a small amount of very dark urine over 6-8 hours. Get help right away if:  You have symptoms of severe dehydration.  You cannot drink fluids without vomiting.  Your symptoms get worse with treatment.  You have a fever.  You have a severe headache.  You have vomiting or diarrhea that: ? Gets worse. ? Does not go away.  You have blood or green matter (bile) in your vomit.  You have blood in your stool. This may cause stool to  look black and tarry.  You have not urinated in 6-8 hours.  You faint.  Your heart rate while sitting still is over 100 beats a minute.  You have trouble breathing. This information is not intended to replace advice given to you by your health care provider. Make sure you discuss any questions you have with your health care provider. Document Released: 10/15/2005 Document Revised: 05/11/2016 Document Reviewed: 12/09/2015 Elsevier Interactive Patient Education  [employer].

## 2017-06-28 ENCOUNTER — Other Ambulatory Visit (INDEPENDENT_AMBULATORY_CARE_PROVIDER_SITE_OTHER): Payer: Worker's Compensation

## 2017-06-28 DIAGNOSIS — E860 Dehydration: Secondary | ICD-10-CM | POA: Diagnosis not present

## 2017-06-28 LAB — BASIC METABOLIC PANEL
BUN: 10 mg/dL (ref 6–23)
CHLORIDE: 103 meq/L (ref 96–112)
CO2: 29 meq/L (ref 19–32)
CREATININE: 1.12 mg/dL (ref 0.40–1.50)
Calcium: 9.1 mg/dL (ref 8.4–10.5)
GFR: 78.82 mL/min (ref >60.00)
Glucose, Bld: 93 mg/dL (ref 70–99)
POTASSIUM: 3.8 meq/L (ref 3.5–5.1)
SODIUM: 138 meq/L (ref 135–145)

## 2017-07-16 ENCOUNTER — Ambulatory Visit: Payer: Commercial Managed Care - HMO | Admitting: Audiology

## 2017-08-02 ENCOUNTER — Ambulatory Visit: Payer: 59 | Admitting: Audiology

## 2017-08-13 ENCOUNTER — Ambulatory Visit: Payer: 59 | Attending: Audiology | Admitting: Audiology

## 2017-08-13 DIAGNOSIS — H93299 Other abnormal auditory perceptions, unspecified ear: Secondary | ICD-10-CM | POA: Diagnosis present

## 2017-08-13 DIAGNOSIS — H93293 Other abnormal auditory perceptions, bilateral: Secondary | ICD-10-CM | POA: Diagnosis present

## 2017-08-13 DIAGNOSIS — R292 Abnormal reflex: Secondary | ICD-10-CM | POA: Insufficient documentation

## 2017-08-13 NOTE — Procedures (Signed)
Outpatient Audiology and [facility] [address] [place], [place]  [address] [phone]  AUDIOLOGICAL AND AUDITORY PROCESSING EVALUATION      Patient Name: Thomas Schaefer   Status: Outpatient   DOB: 03-05-1981    Diagnosis: Central Auditory Processing Disorder MRN: [id number] Date:  08/13/2017     Referent: Myrlene Broker, MD  History: Thomas Schaefer was seen for a Central Auditory Processing Evaluation. 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  He wants to progress in his field and needs to take some college classes, but has Schaefer academic challenges as a child and has been diagnosed with "dyslexia, learning issues" and "attention issues". Thomas Schaefer reports having difficulty with "vowel interpretation" as a child with difficulty in "reading and spelling". 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  Accompanied by: Thomas Schaefer wife Primary Concern: Concerns about auditory processing since there is a family history of Airline pilot. There are communication difficulties at home, primarily with his wife and daughter "feeling that he is not engaged or interested in what they are telling him".  Thomas Schaefer states that he is interested, but states that he "doesn't feel emotions" or is not as emotional as others. Pain: None History of hearing problems: N - has regular hearing tests as part of job which are all "better than most and normal". 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   History of hypertension: N History of diabetes:  N Family history of hearing loss: Y - daughter. Other concerns: Light sensitivity, headaches with florescent  light.  EVALUATION: Pure tone air conduction testing has been within "normal" limits at work and was repeated today to verify results. Hearing thresholds are 10-20 dBHL from  -  bilaterally except for a 30 dBHJL hearing threshold at  only on the left side. Speech reception thresholds are 15 dBHL on the left and 20 dBHL on the right using recorded spondee word lists. Word recognition was 100% at 55 dBHL on the left at and 92% at 60 dBHL on the right using recorded NU-6 word lists, in quiet.  Otoscopic inspection reveals clear ear canals with visible tympanic membranes.  Tympanometry showed borderline middle ear function on the right side with "a long left leg" consistent with possibly chronic or fluctuating middle ear issues (borderline Type A) and normal middle ear function on the left side (Type A). Acoustic reflexes are abnormal bilaterally, absent on the right side and elevated to absent on the left, which requires monitoring of hearing to rule out a progressive hearing loss..   A summary of Thomas Schaefer's central auditory processing evaluation is as follows: Speech-in-Noise testing was performed to determine speech discrimination in the presence of background noise.  Thomas Schaefer scored 72% (abnormal) in the right ear and 86% (normal) in the left ear, when noise was presented 5 dB below speech. Thomas Schaefer is expected to have significant difficulty hearing and understanding in minimal background noise.       The Phonemic Synthesis Schaefer was administered to assess decoding and sound blending skills through word reception.  Thomas Schaefer's quantitative score was 20 correct which is indicates a significant decoding and sound blending deficit, even in quiet.  Remediation with computer based auditory processing program and/or a speech  pathologist is recommended.  The Staggered Spondaic Word Schaefer [facility]) was also administered.  This Schaefer uses spondee words (familiar words consisting of two monosyllabic  words with equal stress on each word) as the Schaefer stimuli.  Different words are directed to each ear, competing and non-competing.  Thomas Schaefer has a slight but significant central auditory processing disorder (CAPD) in the areas of decoding and  tolerance-fading memory. Please note that Thomas Schaefer Schaefer a significant number of Quiet Rehearsal qualifiers that indicate that Thomas Schaefer heard the item but couldn't process it sufficiently well to know what was said. Quiet Rehearsal can be a good strategy for a person who has poor DEC to use under ordinary communicative situations.   Thomas Schaefer (RGDT- a revised AFT-R) was administered to measure temporal processing of minute timing differences. Thomas Schaefer scored within normal with 2-10 msec detection.   Competing Sentences (CS) involved a different sentences being presented to each ear at different volumes. The instructions are to repeat the softer volume sentences. Posterior temporal issues will show poorer performance in the ear contralateral to the lobe involved.  Thomas Schaefer scored 95% in the right ear and 90% in the left ear.  The Schaefer results are slightly, but significantly, abnormal bilaterally and are consistent with Central Auditory Processing Disorder (CAPD) with poor binaural integration.  Dichotic Digits (DD) presents different two digits to each ear. All four digits are to be repeated. Poor performance suggests that cerebellar and/or brainstem may be involved. Thomas Schaefer scored 100% in the right ear and 100% in the left ear. The Schaefer results indicate that Thomas Schaefer scored within normal limits bilaterally.  Musiek's Frequency (Pitch) Pattern Schaefer requires identification of high and low pitch tones presented each ear individually. Poor performance may occur with organization, learning issues or dyslexia.  Thomas Schaefer scored 96% on the left and 84% on the right side. The results are normal on this auditory processing Schaefer.   Summary of Thomas Schaefer's areas  of difficulty: Decoding (in quiet and with a competing message) with NO Temporal Processing Component deals with phonemic processing.  It's an inability to sound out words or difficulty associating written letters with the sounds they represent.  Decoding problems are in difficulties with reading accuracy, oral discourse, phonics and spelling, articulation, receptive language, and understanding directions.  Oral discussions and written tests are particularly difficult. This makes it difficult to understand what is said because the sounds are not readily recognized or because people speak too rapidly.  It may be possible to follow slow, simple or repetitive material, but difficult to keep up with a fast speaker as well as new or abstract material.2. Quiet Rehearsals are indicative of a deficit in Decoding (DEC).  Tolerance-Fading Memory (TFM) is associated with both difficulties understanding speech in the presence of background noise and poor short-term auditory memory.  Difficulties are usually seen in attention span, reading, comprehension and inferences, following directions, poor handwriting, auditory figure-ground, short term memory, expressive and receptive language, inconsistent articulation, oral and written discourse, and problems with distractibility.  Poor Binaural Integration involves problems tying together auditory and visual information are seen.  Severe reading, spelling, decoding, poor handwriting and dyslexia are common and Thomas Schaefer has a history of these.   Reduced Word Recognition in Minimal Background Noise is the inability to hear in the presence of competing noise on the right side only (which may be a "red flag" for dyslexia, which Thomas Schaefer has been previously diagnosed with). This problem may be easily mistaken for  inattention.  Hearing may be excellent in a quiet room but become very poor when a fan, air conditioner or heater come on, paper is rattled or music is turned  on. The background noise does not have to "sound loud" to a normal listener in order for it to be a problem for someone with an auditory processing disorder.      CONCLUSIONS: Barrett has normal hearing thresholds bilaterally except for a borderline slight to mild hearing loss on the left side at  only.Word recognition is excellent in quiet and remains good on the left side, but drops to fair on the right side in minimal background noise. Middle ear function on the right side is borderline, consistent with the reported chronic allergies/sinus issues Pruitt Taboada reports but the left middle ear function is within normal limits. As discussed, middle ear issues may create slight fluctuation in hearing so that follow-up with Myrlene Broker, MD further recommendations and/or medication is recommended.  In addition, please note that the acoustic reflexes are abnormal bilaterally - absent on the right side and elevated to absent on the left side. This may be an early sign of hearing loss, so that Jaishon Plog's hearing needs to be monitored annually.     Two auditory processing Schaefer batteries were administered today: Warren and Musiek. Harvy scored positive for having a slight but significant Central Auditory Processing Disorder (CAPD) in the areas of Decoding and sound blending (in quiet and in minimal background noise) and a Tolerance Fading Memory deficit which includes difficulty hearing in background noise or with a competing message - especially in academic settings. For this reason academic modification will be recommended for college and advanced education. Academically, it is important that Onofre Gains be a) provided with written instructions/study notes without out Yossi having the extra burden of having to seek out a good note-taker. b) allowed extended Schaefer times thin the allowed time and c) allowed testing in a quiet location such as a quiet office or library (not in the  hallway). If available, a personal/classroom amplification system is beneficial.   Central Auditory Processing Disorder (CAPD) creates a hearing difference even when hearing thresholds are within normal limits.  A common characteristic of those with CAPD is insecurity, low self-esteem and auditory fatigue from the extra effort it requires to attempt to hear with faulty processing.  Functionally, CAPD may create a miss match with conversation timing may occur. Because of auditory processing delay, when Michaeljumps into a conversation or feels that it is time to talk, the timing may be a little off - appearing that Arlander interrupts, talks over someone or "blurts". This is common with CAPD, but it can lead to embarrassment, insecurity when communicating with others and social awkwardness. Provideclear slightly slower speech with appropriate pauses- allow time for Michaelto respond and to minimize "blurting" create non-verbal as well as verbal signals of when to respond or not respond. Follow-up with Raiford Noble, speech pathologist is strongly recommended.  In closing, although the results of today's CAPD evaluation are slight but significant, in conjunction with the known history reported by Thomas Schaefer, will compound other pre-existing "learning issues and dyslexia".    RECOMMENDATIONS: 1. Follow-up with Myrlene Broker, MD for medication or recommendation regarding Javelle Banas's history of "allergies/sinus" issues with the borderline right ear middle ear function for comfort and to minimize the possibility of fluctuating hearing loss.   2.  Monitor hearing annually, earlier if there are changes or concerns about hearing because  of the abnormal acoustic reflexes bilaterally, which may be an early sign of hearing loss. Please note that currently Cleotha has normal hearing bilaterally, except for a minimal left ear high frequency hearing loss at  only.  3.  To improve  Marvel Mcphillips primary areas of difficulty music lessons are recommended. Current research strongly indicates that learning to play a musical instrument results in improved neurological function related to auditory processing that benefits decoding, dyslexia and hearing in background noise. Please be aware that being able to play the instrument well does not seem to matter, the benefit comes with the learning. Please refer to the following website for further info: www.brainvolts at [facility], Davonna Belling, PhD.   4.   Based on the results, consultation with Raiford Noble, speech language pathologist, who the family has a relationship is strongly recommended.     5.  For optimal hearing in background noise or when a competing message is present: 1) have conversation face to face and maintain eye contact 2) minimize background noise when having a conversation- turn off the TV, move to a quiet area of the area 3) be aware that auditory processing problems become worse with fatigue and stress so that extra vigilance may be needed to remain involved with conversation 4) Avoid having important conversation when Jasun 's back is to the speaker. 5) avoid "multitasking" with electronic devices during conversation (i.eBoyd Kerbs without looking at phone, computer, video game, etc).  6.  Use hearing protection to protect from loud noise. Please be aware that musician's plugs, are available from [employer] for music related hearing protection because there is no distortion.  Other hearing protection, such as sponge plugs (available at pharmacies) or earmuffs (available at [employer] or [employer]) are useful for noisy activities and venues.  7. For College and Academics:  The following classroom modifications (504 Plan) are recommended:  Encourage the use of technology to assist auditorily in the classroom. Using apps on the ipad/tablet or phone is an effective strategy to record classes or  take notes. Another technology is a Psychiatric nurse or live Doctor, hospital which records while writing taking notes (Note: the Livescribe ECHO is a free standing recording/notetaking device, some of the others require a smartphone or tablet for the microphone portion).   Thomas Schaefer will also need strategic classroom placement for optimal hearing and recording for optimal note-taking. Strategic placement should be away from noise sources, such as hall or street noise, ventilation fans or overhead projector noise etc.   Favor will need class notes/assignments emailed or provided to him hard-copy to minimize missing details.   Allow extended Schaefer times for in class and standardized examinations.  Allow Thomas Schaefer to take examinations in a quiet area, free from auditory distractions.   Total face to face contact time 90 minutes time followed by report writing.   Deborah L. Kate Sable, AuD, CCC-A 08/13/2017

## 2017-08-14 NOTE — Patient Instructions (Addendum)
  AUDIOLOGICAL AND AUDITORY PROCESSING ACADEMIC RECOMMENDATIONS    Patient Name: Thomas EllisMichael Schaefer    DOB: 11/08/1980     Diagnosis: Central Auditory Processing Disorder  (CAPD) Date:  08/13/2017       For College and Academics:  The following classroom modifications (504 Plan) are recommended:   Encourage the use of technology to assist auditorily in the classroom. Using apps on the ipad/tablet or phone is an effective strategy to record classes or take notes. Another technology is a Psychiatric nursesmartpen or live Doctor, hospitalscribe smart pen which records while writing taking notes (Note: the [employer] ECHO is a free standing recording/notetaking device, some of the others require a smartphone or tablet for the microphone portion).   Thomas EllisMichael Heideman will also need strategic classroom placement for optimal hearing and recording for optimal note-taking. Strategic placement should be away from noise sources, such as hall or street noise, ventilation fans or overhead projector noise etc.   Provide notes/assignments:  emailed or provided to him hard-copy to minimize missing details.   Allow extended test times for in class and standardized examinations.  Allow Thomas EllisMichael Champeau to take examinations in a quiet area, free from auditory distractions.    Jarryn Altland L. Kate SableWoodward, Au.D., CCC-A Doctor of Audiology 08/13/2017

## 2017-08-27 ENCOUNTER — Other Ambulatory Visit: Payer: Self-pay | Admitting: Occupational Medicine

## 2017-08-27 ENCOUNTER — Ambulatory Visit: Payer: Self-pay

## 2017-08-27 DIAGNOSIS — M25511 Pain in right shoulder: Secondary | ICD-10-CM

## 2017-08-29 ENCOUNTER — Ambulatory Visit: Payer: Self-pay

## 2018-03-19 IMAGING — DX DG SHOULDER 2+V*R*
4 series · 4 of 4 positions shown · non-contrast
Comparison: No recent prior.

CLINICAL DATA: Right shoulder injury.

EXAM:
RIGHT SHOULDER - 2+ VIEW

[shoulder ap (1 of 2)]
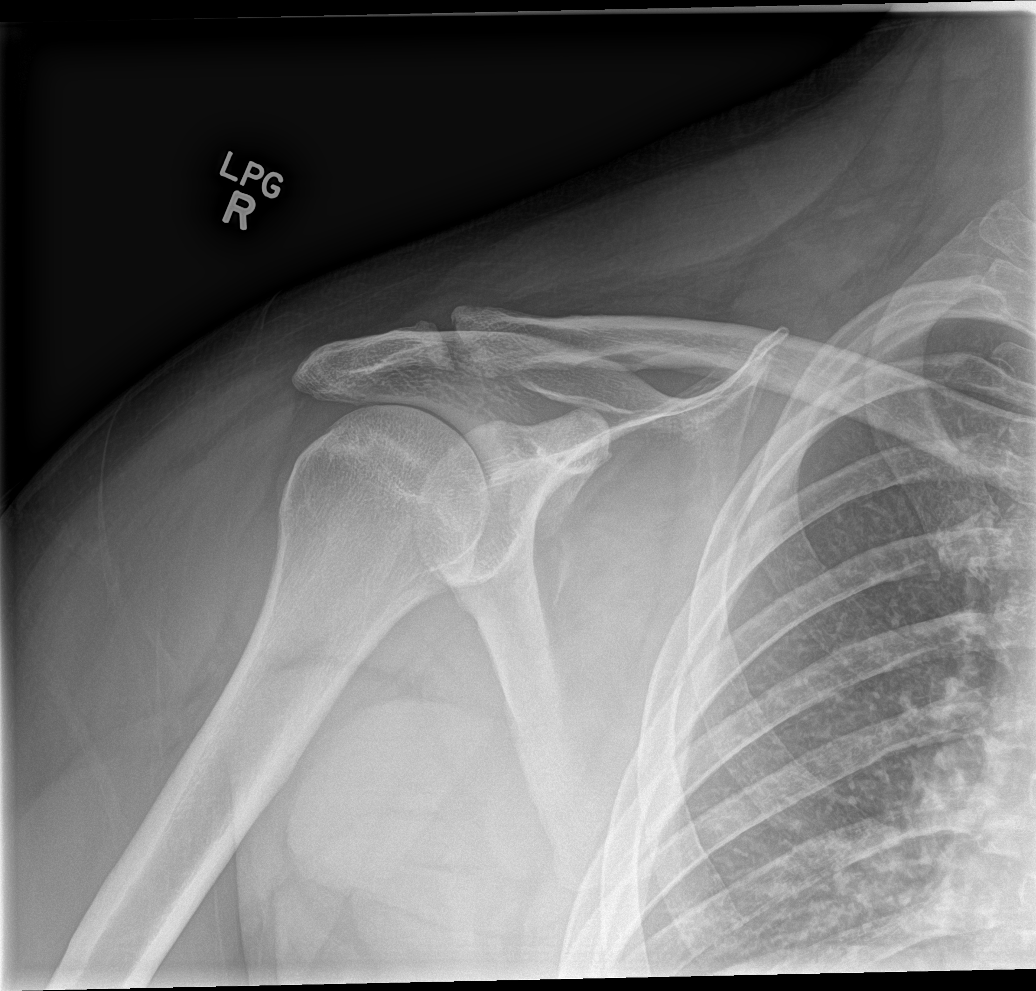

[shoulder ap (2 of 2)]
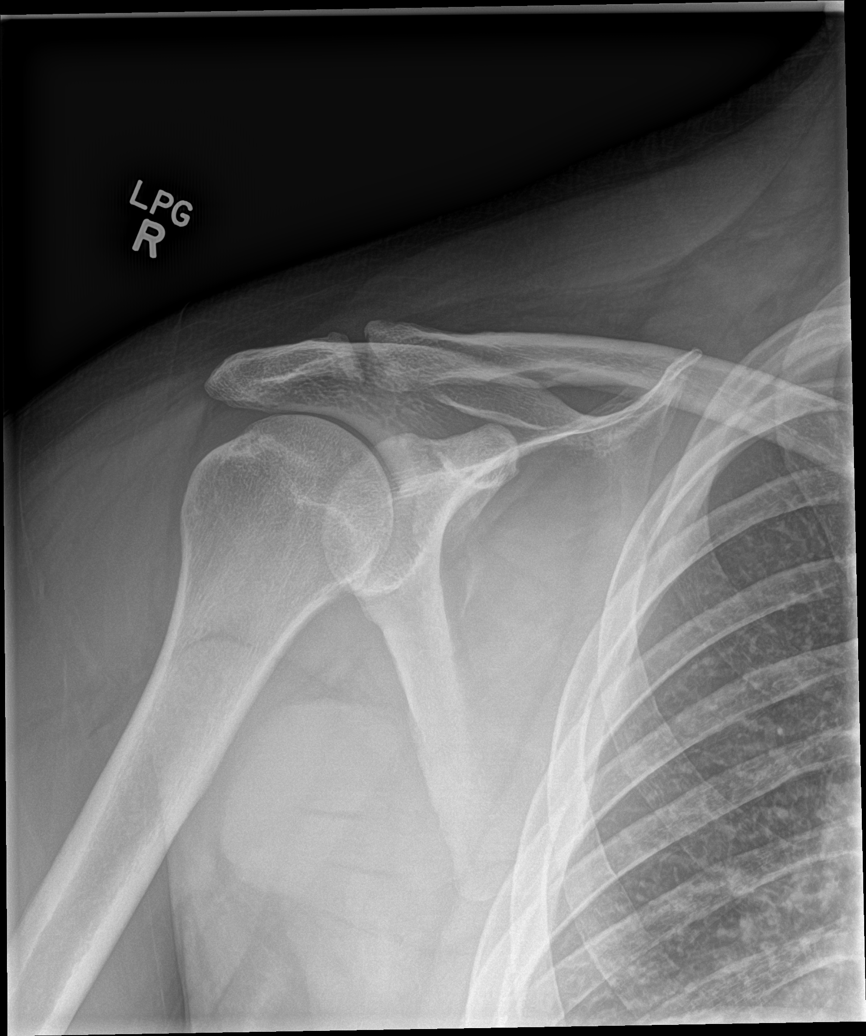

[shoulder y-view]
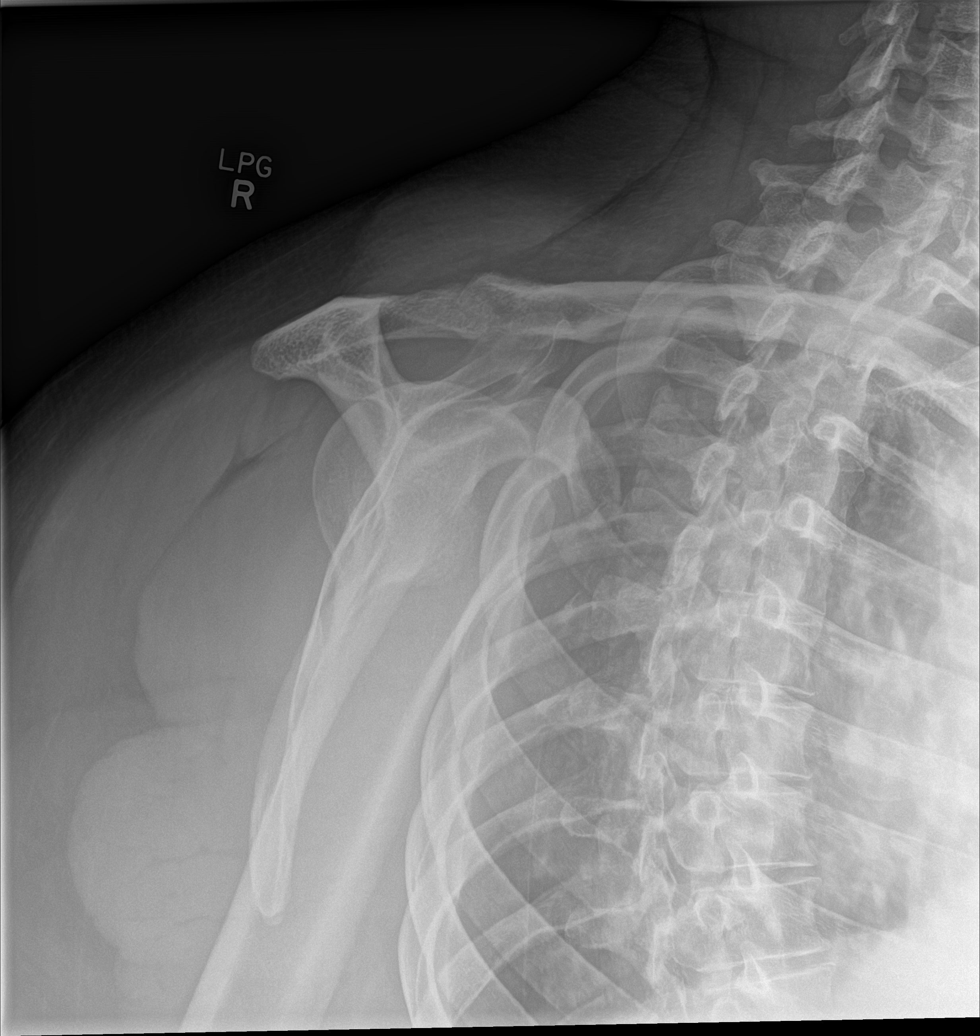

[shoulder axial]
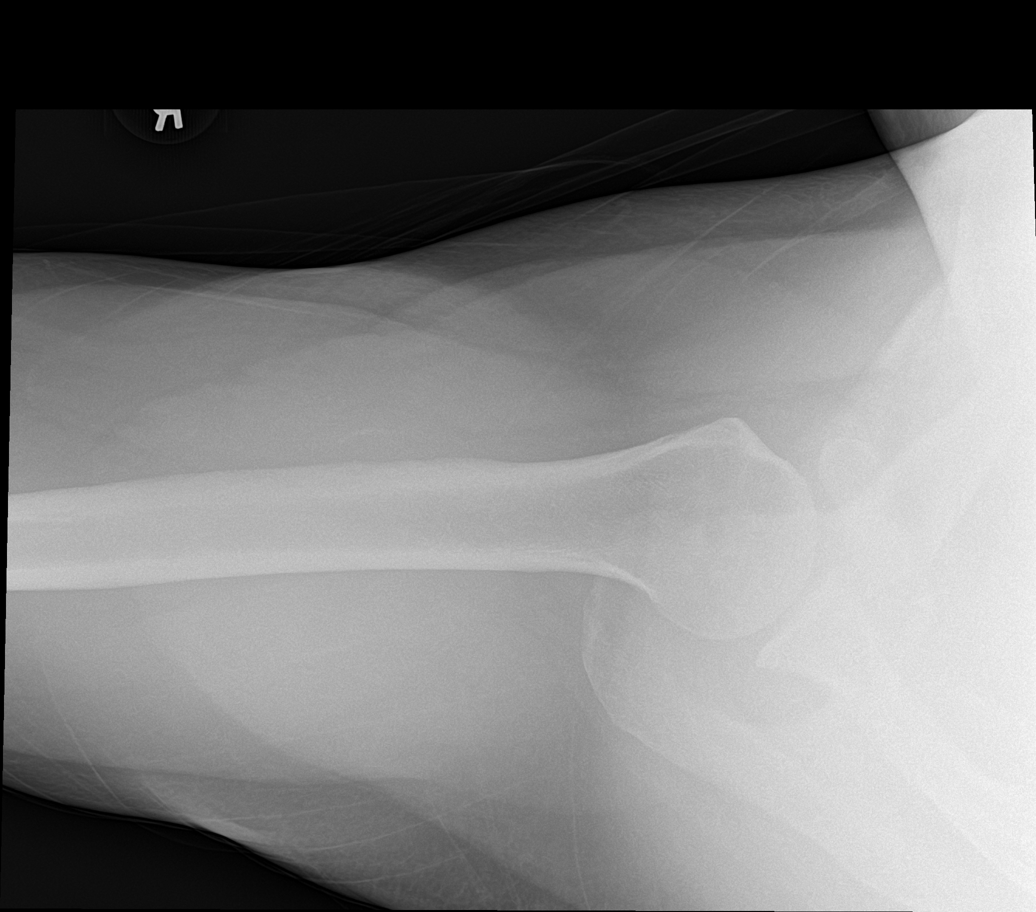

[4 of 4 positions shown; findings below may reference images not displayed]

FINDINGS: Acromioclavicular and glenohumeral degenerative change. No evidence
of fracture or dislocation. No acute abnormality identified.
IMPRESSION: Acromioclavicular and glenohumeral degenerative change. No acute
abnormality identified.

## 2021-02-17 DIAGNOSIS — Z0000 Encounter for general adult medical examination without abnormal findings: Secondary | ICD-10-CM | POA: Diagnosis not present

## 2021-02-17 DIAGNOSIS — E669 Obesity, unspecified: Secondary | ICD-10-CM | POA: Diagnosis not present

## 2021-02-17 DIAGNOSIS — Z23 Encounter for immunization: Secondary | ICD-10-CM | POA: Diagnosis not present

## 2021-02-17 DIAGNOSIS — R7301 Impaired fasting glucose: Secondary | ICD-10-CM | POA: Diagnosis not present

## 2021-04-07 DIAGNOSIS — M79674 Pain in right toe(s): Secondary | ICD-10-CM | POA: Diagnosis not present

## 2021-04-10 DIAGNOSIS — M79671 Pain in right foot: Secondary | ICD-10-CM | POA: Diagnosis not present

## 2021-05-04 ENCOUNTER — Other Ambulatory Visit: Payer: Self-pay

## 2021-05-04 ENCOUNTER — Encounter: Payer: Self-pay | Admitting: Nurse Practitioner

## 2021-05-04 ENCOUNTER — Ambulatory Visit (INDEPENDENT_AMBULATORY_CARE_PROVIDER_SITE_OTHER): Payer: BC Managed Care – PPO | Admitting: Nurse Practitioner

## 2021-05-04 VITALS — BP 101/66 | HR 62 | Temp 98.6°F | Ht 73.0 in | Wt 288.6 lb

## 2021-05-04 DIAGNOSIS — Z7689 Persons encountering health services in other specified circumstances: Secondary | ICD-10-CM

## 2021-05-04 DIAGNOSIS — M79671 Pain in right foot: Secondary | ICD-10-CM | POA: Diagnosis not present

## 2021-05-04 MED ORDER — PREDNISONE 10 MG (21) PO TBPK: ORAL_TABLET | ORAL | 0 refills | Status: DC

## 2021-05-04 MED ORDER — MELOXICAM 7.5 MG PO TABS: 7.5000 mg | ORAL_TABLET | Freq: Two times a day (BID) | ORAL | 1 refills | Status: DC | PRN

## 2021-05-04 NOTE — Progress Notes (Signed)
New Patient Office Visit  Subjective:  Patient ID: Thomas Schaefer, male    DOB: [birth year]  Age: 40 y.o. MRN: [id number]  CC:  Chief Complaint  Patient presents with   New Patient (Initial Visit)    HPI Thomas Schaefer presents to establish new primary care provider.  Patient is a Company secretary.  Has been receiving routine, physical exams from "mobile doc."  An EKG was also done from patient agreeable.  Patient states he was recently diagnosed with gout.  Started with pain and swelling on the bottom of his foot because the smaller 4 toes.  Started about 6 weeks ago.  Did round of colchicine.  This did help some.  Took pain from a high level to a constant, dull pain.  Did not actually resolve the pain.  Has now developed into a throbbing pain when sitting in a sharp, stabbing pain when walking.  Pain is especially bad in the small 4 toes of the right foot. Patient's blood pressure is good.  Reviewed most recent labs which were mostly good.  Most recent EKG was also reviewed.  Did show sinus bradycardia, but was otherwise normal.  The patient denies other physical concerns or complaints.  He denies chest pain, chest pressure, or shortness of breath. He denies headaches or visual disturbances. He denies abdominal pain, nausea, vomiting, or changes in bowel or bladder habits.    Past Medical History:  Diagnosis Date   GERD (gastroesophageal reflux disease)    Hyperlipidemia     Past Surgical History:  Procedure Laterality Date   VASECTOMY      Family History  Problem Relation Age of Onset   Mental illness Mother    Drug abuse Mother    Alcohol abuse Father    Alcohol abuse Maternal Uncle     Social History   Socioeconomic History   Marital status: Married    Spouse name: Not on file   Number of children: Not on file   Years of education: Not on file   Highest education level: Not on file  Occupational History   Not on file  Tobacco Use   Smoking status: Former   Smokeless  tobacco: Never  Substance and Sexual Activity   Alcohol use: Yes   Drug use: No   Sexual activity: Yes  Other Topics Concern   Not on file  Social History Narrative   Not on file   Social Determinants of Health   Financial Resource Strain: Not on file  Food Insecurity: Not on file  Transportation Needs: Not on file  Physical Activity: Not on file  Stress: Not on file  Social Connections: Not on file  Intimate Partner Violence: Not on file    ROS Review of Systems  Constitutional:  Negative for activity change, chills, fatigue and fever.  HENT:  Negative for congestion, postnasal drip, rhinorrhea, sinus pressure and sinus pain.   Eyes: Negative.   Respiratory:  Negative for cough, chest tightness and wheezing.   Cardiovascular:  Negative for chest pain and palpitations.  Gastrointestinal:  Negative for constipation, diarrhea, nausea and vomiting.  Endocrine: Negative for cold intolerance, heat intolerance, polydipsia and polyuria.  Musculoskeletal:  Positive for arthralgias. Negative for back pain and myalgias.       Right foot pain, mostly along the bottom of the foot, and across the 4 small toes.  Pain is worse when standing and walking.  Comes down to a chronic ache when sitting.  Skin:  Negative for rash.  Allergic/Immunologic: Negative.   Neurological:  Negative for dizziness, weakness and headaches.  Hematological: Negative.   Psychiatric/Behavioral:  Negative for dysphoric mood and sleep disturbance. The patient is not nervous/anxious.    Objective:   Today's Vitals   05/04/21 0929  BP: 101/66  Pulse: 62  Temp: 98.6 F (37 C)  SpO2: 97%  Weight: 288 lb 9.6 oz (130.9 kg)  Height: 6\' 1"  (1.854 m)   Body mass index is 38.08 kg/m.   Physical Exam Vitals and nursing note reviewed.  Constitutional:      Appearance: Normal appearance. He is well-developed.  HENT:     Head: Normocephalic and atraumatic.     Nose: Nose normal.  Eyes:     Extraocular  Movements: Extraocular movements intact.     Conjunctiva/sclera: Conjunctivae normal.     Pupils: Pupils are equal, round, and reactive to light.  Cardiovascular:     Rate and Rhythm: Normal rate and regular rhythm.     Pulses: Normal pulses.     Heart sounds: Normal heart sounds.  Pulmonary:     Effort: Pulmonary effort is normal.     Breath sounds: Normal breath sounds.  Abdominal:     Palpations: Abdomen is soft.  Musculoskeletal:        General: Swelling and tenderness present. Normal range of motion.     Cervical back: Normal range of motion and neck supple.     Comments: There is tenderness along the plantar surface of the right foot just below the 4 lower toes.  Some swelling present.  Hurts more when standing up.  Range of motion and strength on her intact.  Lymphadenopathy:     Cervical: No cervical adenopathy.  Skin:    General: Skin is warm and dry.     Capillary Refill: Capillary refill takes less than 2 seconds.  Neurological:     General: No focal deficit present.     Mental Status: He is alert and oriented to person, place, and time.  Psychiatric:        Mood and Affect: Mood normal.        Behavior: Behavior normal.        Thought Content: Thought content normal.        Judgment: Judgment normal.    Assessment & Plan:  1. Encounter to establish care Appointment to establish new primary care provider.  Reviewed most recent labs and EKG with patient.  2. Right foot pain Trial meloxicam 7.5 mg twice daily as needed for pain and inflammation.  We will do prednisone taper.  Take as directed for 6 days.  We will get x-ray of right foot for further evaluation.  Refer to podiatry as indicated. - DG Foot 2 Views Right; Future - predniSONE (STERAPRED UNI-PAK 21 TAB) 10 MG (21) TBPK tablet; 6 day taper - take by mouth as directed for 6 days  Dispense: 21 tablet; Refill: 0 - meloxicam (MOBIC) 7.5 MG tablet; Take 1 tablet (7.5 mg total) by mouth 2 (two) times daily as needed  for pain.  Dispense: 60 tablet; Refill: 1   Problem List Items Addressed This Visit       Other   Encounter to establish care - Primary   Right foot pain   Relevant Medications   predniSONE (STERAPRED UNI-PAK 21 TAB) 10 MG (21) TBPK tablet   meloxicam (MOBIC) 7.5 MG tablet   Other Relevant Orders   DG Foot 2 Views Right    Outpatient Encounter Medications as  of 05/04/2021  Medication Sig   EPIPEN 2-PAK 0.3 MG/0.3ML SOAJ injection    meloxicam (MOBIC) 7.5 MG tablet Take 1 tablet (7.5 mg total) by mouth 2 (two) times daily as needed for pain.   predniSONE (STERAPRED UNI-PAK 21 TAB) 10 MG (21) TBPK tablet 6 day taper - take by mouth as directed for 6 days   Phentermine-Topiramate 3.75-23 MG CP24 Take 1 tablet by mouth daily. (Patient not taking: No sig reported)   No facility-administered encounter medications on file as of 05/04/2021.    Follow-up: Return in about 1 year (around 05/04/2022) for health maintenance exam - will need to bring lab results with him - also see below .   Carlean Jews, NP

## 2021-05-14 DIAGNOSIS — Z7689 Persons encountering health services in other specified circumstances: Secondary | ICD-10-CM | POA: Insufficient documentation

## 2021-05-14 DIAGNOSIS — M79671 Pain in right foot: Secondary | ICD-10-CM | POA: Insufficient documentation

## 2021-05-31 ENCOUNTER — Other Ambulatory Visit: Payer: Self-pay | Admitting: Nurse Practitioner

## 2021-05-31 ENCOUNTER — Ambulatory Visit
Admission: RE | Admit: 2021-05-31 | Discharge: 2021-05-31 | Disposition: A | Discharge status: Home / Self Care | Payer: BC Managed Care – PPO | Source: Ambulatory Visit | Attending: Nurse Practitioner | Admitting: Nurse Practitioner

## 2021-05-31 DIAGNOSIS — M2011 Hallux valgus (acquired), right foot: Secondary | ICD-10-CM | POA: Diagnosis not present

## 2021-05-31 DIAGNOSIS — M19071 Primary osteoarthritis, right ankle and foot: Secondary | ICD-10-CM | POA: Diagnosis not present

## 2021-05-31 DIAGNOSIS — M79671 Pain in right foot: Secondary | ICD-10-CM

## 2021-05-31 DIAGNOSIS — M7731 Calcaneal spur, right foot: Secondary | ICD-10-CM | POA: Diagnosis not present

## 2021-06-05 NOTE — Progress Notes (Signed)
Please let the patient know that he does have some degenerative spurring of multiple toes. There is also bunion formation with sprurring of the great toe and this stretches into the middle of the foot. He also has some plantar spurring and achilles tendon spurring. Does he want to see a podiatrist for further evaluation and treatment?

## 2021-07-11 ENCOUNTER — Other Ambulatory Visit: Payer: Self-pay | Admitting: Nurse Practitioner

## 2021-07-11 DIAGNOSIS — M79671 Pain in right foot: Secondary | ICD-10-CM

## 2021-07-18 ENCOUNTER — Ambulatory Visit (INDEPENDENT_AMBULATORY_CARE_PROVIDER_SITE_OTHER): Payer: BC Managed Care – PPO | Admitting: Podiatry

## 2021-07-18 ENCOUNTER — Other Ambulatory Visit: Payer: Self-pay

## 2021-07-18 DIAGNOSIS — M778 Other enthesopathies, not elsewhere classified: Secondary | ICD-10-CM | POA: Diagnosis not present

## 2021-07-18 DIAGNOSIS — M10071 Idiopathic gout, right ankle and foot: Secondary | ICD-10-CM

## 2021-07-18 MED ORDER — METHYLPREDNISOLONE 4 MG PO TBPK: ORAL_TABLET | ORAL | 0 refills | Status: DC

## 2021-07-18 MED ORDER — TRIAMCINOLONE ACETONIDE 40 MG/ML IJ SUSP
20.0000 mg | Freq: Once | INTRAMUSCULAR | Status: AC
  Administered 2021-07-18: 20 mg

## 2021-07-19 NOTE — Progress Notes (Signed)
Subjective:  Patient ID: Thomas Schaefer, male    DOB: 07/31/1981,  MRN: [id number] HPI Chief Complaint  Patient presents with   Foot Pain    Right foot pain- mentioned he was seen at [facility]. On 8/3 xrays were taken was given colchicine for gout. Uric acid levels were taken and was reported it was at a high level.     40 y.o. male presents with the above complaint.   ROS: Denies fever chills nausea vomiting muscle aches pains calf pain back pain chest pain shortness of breath.  Past Medical History:  Diagnosis Date   GERD (gastroesophageal reflux disease)    Hyperlipidemia    Past Surgical History:  Procedure Laterality Date   VASECTOMY      Current Outpatient Medications:    methylPREDNISolone (MEDROL DOSEPAK) 4 MG TBPK tablet, 6 day dose pack - take as directed, Disp: 21 tablet, Rfl: 0   colchicine 0.6 MG tablet, Take 0.6 mg by mouth 2 (two) times daily., Disp: , Rfl:    EPIPEN 2-PAK 0.3 MG/0.3ML SOAJ injection, , Disp: , Rfl:    meloxicam (MOBIC) 7.5 MG tablet, Take 1 tablet (7.5 mg total) by mouth 2 (two) times daily as needed for pain., Disp: 60 tablet, Rfl: 1   Phentermine-Topiramate 3.75-23 MG CP24, Take 1 tablet by mouth daily. (Patient not taking: No sig reported), Disp: 14 capsule, Rfl: 0   predniSONE (STERAPRED UNI-PAK 21 TAB) 10 MG (21) TBPK tablet, 6 day taper - take by mouth as directed for 6 days, Disp: 21 tablet, Rfl: 0  Allergies  Allergen Reactions   Bee Venom    Review of Systems Objective:  There were no vitals filed for this visit.  General: Well developed, nourished, in no acute distress, alert and oriented x3   Dermatological: Skin is warm, dry and supple bilateral. Nails x 10 are well maintained; remaining integument appears unremarkable at this time. There are no open sores, no preulcerative lesions, no rash or signs of infection present.  Vascular: Dorsalis Pedis artery and Posterior Tibial artery pedal pulses are 2/4 bilateral with immedate  capillary fill time. Pedal hair growth present. No varicosities and no lower extremity edema present bilateral.   Neruologic: Grossly intact via light touch bilateral. Vibratory intact via tuning fork bilateral. Protective threshold with Semmes Wienstein monofilament intact to all pedal sites bilateral. Patellar and Achilles deep tendon reflexes 2+ bilateral. No Babinski or clonus noted bilateral.   Musculoskeletal: No gross boney pedal deformities bilateral. No pain, crepitus, or limitation noted with foot and ankle range of motion bilateral. Muscular strength 5/5 in all groups tested bilateral.  There is some pain and warmth on palpation of the second and third metatarsophalangeal joint right foot.  There is tenderness on end range of motion on dorsiflexion minimally so on plantarflexion.  There is some soft tissue swelling to the plantar aspect of the forefoot.  Gait: Unassisted, Nonantalgic.    Radiographs:  Radiographs were reviewed today not demonstrating any type of osseous abnormalities forefoot right.  Only soft tissue edema forefoot.  Assessment & Plan:   Assessment: Capsulitis of the second and third metatarsophalangeal joints cannot rule out arthritic possibilities such as hyperuricemia.  Plan: Starting him on a Medrol Dosepak and I injected the second interdigital space today with 10 mg of Kenalog 5 mg of Marcaine.  Discussed appropriate shoe gear stretching exercise ice therapy sugar modifications.  Also requesting an arthritic profile with CBC.     Reilynn Lauro T. [place], [place]

## 2021-07-20 ENCOUNTER — Telehealth: Payer: Self-pay | Admitting: *Deleted

## 2021-07-20 DIAGNOSIS — R768 Other specified abnormal immunological findings in serum: Secondary | ICD-10-CM

## 2021-07-20 DIAGNOSIS — E790 Hyperuricemia without signs of inflammatory arthritis and tophaceous disease: Secondary | ICD-10-CM

## 2021-07-20 LAB — CBC WITH DIFFERENTIAL/PLATELET
Absolute Monocytes: 546 cells/uL (ref 200–950)
Basophils Absolute: 70 cells/uL (ref 0–200)
Basophils Relative: 0.9 %
Eosinophils Absolute: 289 cells/uL (ref 15–500)
Eosinophils Relative: 3.7 %
HCT: 45.7 % (ref 38.5–50.0)
Hemoglobin: 15.7 g/dL (ref 13.2–17.1)
Lymphs Abs: 1973 cells/uL (ref 850–3900)
MCH: 29 pg (ref 27.0–33.0)
MCHC: 34.4 g/dL (ref 32.0–36.0)
MCV: 84.3 fL (ref 80.0–100.0)
MPV: 10.1 fL (ref 7.5–12.5)
Monocytes Relative: 7 %
Neutro Abs: 4922 cells/uL (ref 1500–7800)
Neutrophils Relative %: 63.1 %
Platelets: 248 10*3/uL (ref 140–400)
RBC: 5.42 10*6/uL (ref 4.20–5.80)
RDW: 12.9 % (ref 11.0–15.0)
Total Lymphocyte: 25.3 %
WBC: 7.8 10*3/uL (ref 3.8–10.8)

## 2021-07-20 LAB — RHEUMATOID FACTOR: Rheumatoid fact SerPl-aCnc: 125 IU/mL — ABNORMAL HIGH (ref <14)

## 2021-07-20 LAB — URIC ACID: Uric Acid, Serum: 11.1 mg/dL — ABNORMAL HIGH (ref 4.0–8.0)

## 2021-07-20 LAB — SEDIMENTATION RATE: Sed Rate: 6 mm/h (ref 0–15)

## 2021-07-20 LAB — ANA: Anti Nuclear Antibody (ANA): NEGATIVE

## 2021-07-20 LAB — C-REACTIVE PROTEIN: CRP: 3 mg/L (ref <8.0)

## 2021-07-20 NOTE — Telephone Encounter (Signed)
-----   Message from Elinor Parkinson, [place] sent at 07/20/2021  7:23 AM EDT ----- Morrie Sheldon please refer Mr. Nill to rheumatology.  He has a very high rheumatoid factor as well as Uric acid.

## 2021-08-19 NOTE — Progress Notes (Signed)
Office Visit Note  Patient: Thomas Schaefer             Date of Birth: 02-03-1981           MRN: [id number]             PCP: Ronnell Freshwater, NP Referring: Garrel Ridgel, DPM Visit Date: 08/21/2021 Occupation: Firefighter  Subjective:  New Patient (Initial Visit) (Right foot swelling and pain)   History of Present Illness: Thomas Schaefer is a 40 y.o. male here for foot pain and inflammation with elevated rheumatoid factor and uric acid.  Symptoms started since around July of this year with pain on the right foot worse at the bottom of the foot and the smaller for toes.  Initial treatment with colchicine had partial benefit in symptoms.  There is also considerable initial swelling.  He received a short course of oral steroids with more symptom improvement but this persisted.  He saw Dr. Milinda Pointer in September treatment with local steroid infection and oral prednisone taper and blood tests were checked.  Inflammatory markers were normal but rheumatoid factor and uric acid were significantly elevated.  Xray of the foot has shown soft tissue edema without significant osseus abnormalities.  After the injection and additional medication symptoms again improved at this point he is having persistent mild daily symptoms with some pain worsened with pressure and weight on the ball of his foot without much active swelling. He has had some chronic pain and instability of the right knee for a much longer time without any identified problem, xrays have been normal.  He does not drink much alcohol regularly.  He used to drink sodas very heavily but has already decreased this to regular but small amounts before any onset of the symptoms.  Labs reviewed 06/2021 ANA neg RF 125 Uric acid 11.1 ESR 6 CRP 3.0  Activities of Daily Living:  Patient reports morning stiffness for 0  none .   Patient Denies nocturnal pain.  Difficulty dressing/grooming: Denies Difficulty climbing stairs: Denies Difficulty getting  out of chair: Denies Difficulty using hands for taps, buttons, cutlery, and/or writing: Denies  Review of Systems  Constitutional:  Negative for fatigue.  HENT:  Negative for mouth dryness.   Eyes:  Negative for dryness.  Respiratory:  Negative for shortness of breath.   Cardiovascular:  Positive for swelling in legs/feet.  Gastrointestinal:  Negative for constipation.  Endocrine: Positive for excessive thirst.  Genitourinary:  Negative for difficulty urinating.  Musculoskeletal:  Positive for joint pain, joint pain and joint swelling.  Skin:  Negative for rash.  Allergic/Immunologic: Negative for susceptible to infections.  Neurological:  Negative for numbness.  Hematological:  Negative for bruising/bleeding tendency.  Psychiatric/Behavioral:  Positive for sleep disturbance.    PMFS History:  Patient Active Problem List   Diagnosis Date Noted   Hyperuricemia 08/21/2021   Encounter to establish care 05/14/2021   Right foot pain 05/14/2021   Obesity 07/13/2015   Mood disorder ([place]) 07/13/2015   ALLERGIC RHINITIS 06/06/2007   GERD 06/06/2007   IRRITABLE BOWEL SYNDROME 06/06/2007    Past Medical History:  Diagnosis Date   GERD (gastroesophageal reflux disease)    Hyperlipidemia     Family History  Problem Relation Age of Onset   Mental illness Mother    Drug abuse Mother    Diabetes Father    Alcohol abuse Father    Stroke Father    Alcohol abuse Maternal Uncle    Past Surgical History:  Procedure Laterality Date   TOE SURGERY Left    VASECTOMY     Social History   Social History Narrative   Not on file   Immunization History  Administered Date(s) Administered   Moderna Sars-Covid-2 Vaccination 11/04/2019, 12/03/2019     Objective: Vital Signs: BP 111/66 (BP Location: Right Arm, Patient Position: Sitting, Cuff Size: Normal)   Pulse 75   Resp 15   Ht _0  (1.854 m)   Wt 298 lb (135.2 kg)   BMI 39.32 kg/m    Physical Exam Constitutional:       Appearance: He is obese.  Eyes:     Conjunctiva/sclera: Conjunctivae normal.  Cardiovascular:     Rate and Rhythm: Normal rate and regular rhythm.  Pulmonary:     Effort: Pulmonary effort is normal.     Breath sounds: Normal breath sounds.  Skin:    General: Skin is warm and dry.     Findings: No rash.  Neurological:     Mental Status: He is alert.     Deep Tendon Reflexes: Reflexes normal.  Psychiatric:        Mood and Affect: Mood normal.     Musculoskeletal Exam:  Elbows full ROM no tenderness or swelling Wrists full ROM no tenderness or swelling Fingers full ROM no tenderness or swelling Knees full ROM no tenderness or swelling Ankles full ROM no tenderness or swelling Right sided tenderness to pressure around 3rd-4th MTPs with pain worst in plantarflexion, no palpable effusions, limited ultrasound inspection showing synovial hypertrophy without large effusion, no double contour sign or color doppler enhancement    Investigation: No additional findings.  Imaging: No results found.  Recent Labs: Lab Results  Component Value Date   WBC 7.8 07/18/2021   HGB 15.7 07/18/2021   PLT 248 07/18/2021   NA 138 06/28/2017   K 3.8 06/28/2017   CL 103 06/28/2017   CO2 29 06/28/2017   GLUCOSE 93 06/28/2017   BUN 10 06/28/2017   CREATININE 1.12 06/28/2017   BILITOT 0.7 07/11/2015   ALKPHOS 64 07/11/2015   AST 26 07/11/2015   ALT 32 07/11/2015   PROT 6.9 07/11/2015   ALBUMIN 4.0 07/11/2015   CALCIUM 9.1 06/28/2017   GFRAA >60 06/24/2017    Speciality Comments: No specialty comments available.  Procedures:  No procedures performed Allergies: Bee venom   Assessment / Plan:     Visit Diagnoses: Right foot pain  Hyperuricemia - Plan: allopurinol (ZYLOPRIM) 300 MG tablet, colchicine 0.6 MG tablet  Right foot pain localizes pretty specifically around the MTP joints worst at the third digit which also shows ultrasound changes synovial hypertrophy possibly pannus or  could have active inflammation although no signs of increased blood flow at this time.  This is not a typical initial site for gouty arthritis but is possible.  No significant underlying injury or damage to explain this based on recent x-ray.  Rheumatoid factor is high no systemic signs of disease activity.  We will treat initially with urate lowering therapy start allopurinol 300 mg p.o. daily.  Recommend he can take colchicine 0.6 mg p.o. daily as needed for pain or swelling control.  Provided some printed information on low purine diet.  We will recheck uric acid in a month to follow-up response to treatment.  Orders: No orders of the defined types were placed in this encounter.  Meds ordered this encounter  Medications   allopurinol (ZYLOPRIM) 300 MG tablet    Sig: Take 1 tablet (  300 mg total) by mouth daily.    Dispense:  30 tablet    Refill:  1   colchicine 0.6 MG tablet    Sig: Take 1 tablet (0.6 mg total) by mouth daily as needed.    Dispense:  30 tablet    Refill:  1     Follow-Up Instructions: Return in about 4 weeks (around 09/18/2021) for New pt allopurinol start f/u 4wks.   Collier Salina, MD  Note - This record has been created using [employer].  Chart creation errors have been sought, but may not always  have been located. Such creation errors do not reflect on  the standard of medical care.

## 2021-08-21 ENCOUNTER — Other Ambulatory Visit: Payer: Self-pay

## 2021-08-21 ENCOUNTER — Ambulatory Visit: Payer: BC Managed Care – PPO | Admitting: Internal Medicine

## 2021-08-21 ENCOUNTER — Encounter: Payer: Self-pay | Admitting: Internal Medicine

## 2021-08-21 VITALS — BP 111/66 | HR 75 | Resp 15 | Ht 73.0 in | Wt 298.0 lb

## 2021-08-21 DIAGNOSIS — E790 Hyperuricemia without signs of inflammatory arthritis and tophaceous disease: Secondary | ICD-10-CM | POA: Diagnosis not present

## 2021-08-21 DIAGNOSIS — M79671 Pain in right foot: Secondary | ICD-10-CM

## 2021-08-21 MED ORDER — ALLOPURINOL 300 MG PO TABS: 300.0000 mg | ORAL_TABLET | Freq: Every day | ORAL | 1 refills | Status: DC

## 2021-08-21 MED ORDER — COLCHICINE 0.6 MG PO TABS: 0.6000 mg | ORAL_TABLET | Freq: Every day | ORAL | 1 refills | Status: DC | PRN

## 2021-08-21 NOTE — Patient Instructions (Signed)
Low-Purine Eating Plan A low-purine eating plan involves making food choices to limit your intake of purine. Purine is a kind of uric acid. Too much uric acid in your blood can cause certain conditions, such as gout and kidney stones. Eating a low-purine diet can help control these conditions. What are tips for following this plan? Reading food labels Avoid foods with saturated or Trans fat. Check the ingredient list of grains-based foods, such as bread and cereal, to make sure that they contain whole grains. Check the ingredient list of sauces or soups to make sure they do not contain meat or fish. When choosing soft drinks, check the ingredient list to make sure they do not contain high-fructose corn syrup. Shopping  Buy plenty of fresh fruits and vegetables. Avoid buying canned or fresh fish. Buy dairy products labeled as low-fat or nonfat. Avoid buying premade or processed foods. These foods are often high in fat, salt (sodium), and added sugar. Cooking Use olive oil instead of butter when cooking. Oils like olive oil, canola oil, and sunflower oil contain healthy fats. Meal planning Learn which foods do or do not affect you. If you find out that a food tends to cause your gout symptoms to flare up, avoid eating that food. You can enjoy foods that do not cause problems. If you have any questions about a food item, talk with your dietitian or health care provider. Limit foods high in fat, especially saturated fat. Fat makes it harder for your body to get rid of uric acid. Choose foods that are lower in fat and are lean sources of protein. General guidelines Limit alcohol intake to no more than 1 drink a day for nonpregnant women and 2 drinks a day for men. One drink equals 12 oz of beer, 5 oz of wine, or 1 oz of hard liquor. Alcohol can affect the way your body gets rid of uric acid. Drink plenty of water to keep your urine clear or pale yellow. Fluids can help remove uric acid from your  body. If directed by your health care provider, take a vitamin C supplement. Work with your health care provider and dietitian to develop a plan to achieve or maintain a healthy weight. Losing weight can help reduce uric acid in your blood. What foods are recommended? The items listed may not be a complete list. Talk with your dietitian about what dietary choices are best for you. Foods low in purines Foods low in purines do not need to be limited. These include: All fruits. All low-purine vegetables, pickles, and olives. Breads, pasta, Thomas Schaefer, cornbread, and popcorn. Cake and other baked goods. All dairy foods. Eggs, nuts, and nut butters. Spices and condiments, such as salt, herbs, and vinegar. Plant oils, butter, and margarine. Water, sugar-free soft drinks, tea, coffee, and cocoa. Vegetable-based soups, broths, sauces, and gravies. Foods moderate in purines Foods moderate in purines should be limited to the amounts listed.  cup of asparagus, cauliflower, spinach, mushrooms, or green peas, each day. 2/3 cup uncooked oatmeal, each day.  cup dry wheat bran or wheat germ, each day. 2-3 ounces of meat or poultry, each day. 4-6 ounces of shellfish, such as crab, lobster, oysters, or shrimp, each day. 1 cup cooked beans, peas, or lentils, each day. Soup, broths, or bouillon made from meat or fish. Limit these foods as much as possible. What foods are not recommended? The items listed may not be a complete list. Talk with your dietitian about what dietary choices are best  for you. Limit your intake of foods high in purines, including: Beer and other alcohol. Meat-based gravy or sauce. Canned or fresh fish, such as: Anchovies, sardines, herring, and tuna. Mussels and scallops. Codfish, trout, and haddock. Thomas Schaefer. Organ meats, such as: Liver or kidney. Tripe. Sweetbreads (thymus gland or pancreas). Wild Education officer, environmental. Yeast or yeast extract supplements. Drinks sweetened with  high-fructose corn syrup. Summary Eating a low-purine diet can help control conditions caused by too much uric acid in the body, such as gout or kidney stones. Choose low-purine foods, limit alcohol, and limit foods high in fat. You will learn over time which foods do or do not affect you. If you find out that a food tends to cause your gout symptoms to flare up, avoid eating that food.   Allopurinol Tablets What is this medication? ALLOPURINOL (al oh PURE i nole) treats gout or kidney stones. It may also be used to prevent high uric acid levels after chemotherapy. It works by decreasing uric acid levels in your body. This medicine may be used for other purposes; ask your health care provider or pharmacist if you have questions. COMMON BRAND NAME(S): Zyloprim What should I tell my care team before I take this medication? They need to know if you have any of these conditions: Kidney disease Liver disease An unusual or allergic reaction to allopurinol, other medications, foods, dyes, or preservatives Pregnant or trying to get pregnant Breast-feeding How should I use this medication? Take this medication by mouth with a glass of water. Follow the directions on the prescription label. If this medication upsets your stomach, take it with food or milk. Take your doses at regular intervals. Do not take your medication more often than directed. Talk to your care team regarding the use of this medication in children. Special care may be needed. While this medication may be prescribed for children as young as 6 years for selected conditions, precautions do apply. Overdosage: If you think you have taken too much of this medicine contact a poison control center or emergency room at once. NOTE: This medicine is only for you. Do not share this medicine with others. What if I miss a dose? If you miss a dose, take it as soon as you can. If it is almost time for your next dose, take only that dose. Do not  take double or extra doses. What may interact with this medication? Do not take this medication with the following: Didanosine, ddI This medication may also interact with the following: Certain antibiotics like amoxicillin, ampicillin Certain medications for cancer Certain medications for immunosuppression like azathioprine, cyclosporine, mercaptopurine Chlorpropamide Probenecid Sulfinpyrazone Thiazide diuretics, like hydrochlorothiazide Warfarin This list may not describe all possible interactions. Give your health care provider a list of all the medicines, herbs, non-prescription drugs, or dietary supplements you use. Also tell them if you smoke, drink alcohol, or use illegal drugs. Some items may interact with your medicine. What should I watch for while using this medication? Visit your care team for regular checks on your progress. If you are taking this medication to treat gout, you may not have less frequent attacks at first. Keep taking your medication regularly and the attacks should get better within 2 to 6 weeks. Drink plenty of water (10 to 12 full glasses a day) while you are taking this medication. This will help to reduce stomach upset and reduce the risk of getting gout or kidney stones. Call your care team at once if you get  a skin rash together with chills, fever, sore throat, or nausea and vomiting, if you have blood in your urine, or difficulty passing urine. This medication may cause serious skin reactions. They can happen weeks to months after starting the medication. Contact your care team right away if you notice fevers or flu-like symptoms with a rash. The rash may be red or purple and then turn into blisters or peeling of the skin. Or, you might notice a red rash with swelling of the face, lips or lymph nodes in your neck or under your arms. Do not take vitamin C without asking your care team. Too much vitamin C can increase the chance of getting kidney stones. You may  get drowsy or dizzy. Do not drive, use machinery, or do anything that needs mental alertness until you know how this medication affects you. Do not stand or sit up quickly, especially if you are an older patient. This reduces the risk of dizzy or fainting spells. Alcohol can make you more drowsy and dizzy. Alcohol can also increase the chance of stomach problems and increase the amount of uric acid in your blood. Avoid alcoholic drinks. What side effects may I notice from receiving this medication? Side effects that you should report to your care team as soon as possible: Allergic reactions-skin rash, itching, hives, swelling of the face, lips, tongue, or throat Kidney injury-decrease in the amount of urine, swelling of the ankles, hands, or feet Liver injury-right upper belly pain, loss of appetite, nausea, light-colored stool, dark yellow or brown urine, yellowing skin or eyes, unusual weakness or fatigue Rash, fever, and swollen lymph nodes Redness, blistering, peeling, or loosening of the skin, including inside the mouth Side effects that usually do not require medical attention (report to your care team if they continue or are bothersome): Diarrhea Drowsiness Nausea Vomiting This list may not describe all possible side effects. Call your doctor for medical advice about side effects. You may report side effects to FDA at 1-800-FDA-1088. Where should I keep my medication? Keep out of the reach of children and pets. Store at room temperature between 15 and 25 degrees C (59 and 77 degrees F). Protect from light and moisture. Throw away any unused medication after the expiration date.  Colchicine Tablets What is this medication? COLCHICINE (KOL chi seen) prevents and treats gout attacks. It may also be used to treat familial Mediterranean fever (FMF). It works by decreasing inflammation and reducing the buildup of uric acid in your joints. This medicine may be used for other purposes; ask your  health care provider or pharmacist if you have questions. COMMON BRAND NAME(S): Colcrys What should I tell my care team before I take this medication? They need to know if you have any of these conditions: Kidney disease Liver disease An unusual or allergic reaction to colchicine, other medications, foods, dyes, or preservatives Pregnant or trying to get pregnant Breast-feeding How should I use this medication? Take this medication by mouth with a full glass of water. Follow the directions on the prescription label. You can take it with or without food. If it upsets your stomach, take it with food. Take your medication at regular intervals. Do not take your medication more often than directed. A special MedGuide will be given to you by the pharmacist with each prescription and refill. Be sure to read this information carefully each time. Talk to your care team regarding the use of this medication in children. While this medication may be prescribed  for children as young as 54 years old for selected conditions, precautions do apply. Patients over 83 years old may have a stronger reaction and need a smaller dose. Overdosage: If you think you have taken too much of this medicine contact a poison control center or emergency room at once. NOTE: This medicine is only for you. Do not share this medicine with others. What if I miss a dose? If you miss a dose, take it as soon as you can. If it is almost time for your next dose, take only that dose. Do not take double or extra doses. What may interact with this medication? Do not take this medication with any of the following: Certain antivirals for HIV or hepatitis This medication may also interact with the following: Certain antibiotics like erythromycin or clarithromycin Certain medications for blood pressure, heart disease, irregular heart beat Certain medications for cholesterol like atorvastatin, lovastatin, and simvastatin Certain medications  for fungal infections like ketoconazole, itraconazole, or posaconazole Cyclosporine Grapefruit or grapefruit juice This list may not describe all possible interactions. Give your health care provider a list of all the medicines, herbs, non-prescription drugs, or dietary supplements you use. Also tell them if you smoke, drink alcohol, or use illegal drugs. Some items may interact with your medicine. What should I watch for while using this medication? Visit your care team for regular checks on your progress. Tell your care team if your symptoms do not start to get better or if they get worse. You should make sure you get enough vitamin B12 while you are taking this medication. Discuss the foods you eat and the vitamins you take with your care team. This medication may increase your risk to bruise or bleed. Call you care team if you notice any unusual bleeding. What side effects may I notice from receiving this medication? Side effects that you should report to your care team as soon as possible: Allergic reactions-skin rash, itching, hives, swelling of the face, lips, tongue, or throat Infection-fever, chills, cough, sore throat, wounds that don't heal, pain or trouble when passing urine, general feeling of discomfort or being unwell Muscle injury-unusual weakness or fatigue, muscle pain, dark yellow or brown urine, decrease in the amount of urine Pain, tingling, or numbness in the hands or feet Unusual bleeding or bruising Side effects that usually do not require medical attention (report these to your care team if they continue or are bothersome): Diarrhea Nausea Vomiting This list may not describe all possible side effects. Call your doctor for medical advice about side effects. You may report side effects to FDA at 1-800-FDA-1088. Where should I keep my medication? Keep out of the reach of children and pets. Store at room temperature between 15 and 30 degrees C (59 and 86 degrees F). Keep  container tightly closed. Protect from light. Throw away any unused medication after the expiration date. NOTE: This sheet is a summary. It may not cover all possible information. If you have questions about this medicine, talk to your doctor, pharmacist, or health care provider.  2022 Elsevier/Gold Standard (2020-11-08 15:50:29)

## 2021-09-18 ENCOUNTER — Ambulatory Visit: Payer: BC Managed Care – PPO | Admitting: Internal Medicine

## 2021-10-31 ENCOUNTER — Other Ambulatory Visit: Payer: Self-pay | Admitting: Nurse Practitioner

## 2021-10-31 ENCOUNTER — Telehealth: Payer: Self-pay | Admitting: Nurse Practitioner

## 2021-10-31 DIAGNOSIS — J069 Acute upper respiratory infection, unspecified: Secondary | ICD-10-CM

## 2021-10-31 DIAGNOSIS — U071 COVID-19: Secondary | ICD-10-CM

## 2021-10-31 MED ORDER — MOLNUPIRAVIR EUA 200MG CAPSULE: 4.0000 | ORAL_CAPSULE | Freq: Two times a day (BID) | ORAL | 0 refills | Status: AC

## 2021-10-31 MED ORDER — MOLNUPIRAVIR EUA 200MG CAPSULE: 4.0000 | ORAL_CAPSULE | Freq: Two times a day (BID) | ORAL | 0 refills | Status: DC

## 2021-10-31 NOTE — Telephone Encounter (Signed)
Patient tested + covid yesterday am and is currently taking care of wife due to ankle surgery. Requesting something to be called in. [phone]

## 2021-10-31 NOTE — Telephone Encounter (Signed)
I cannot change the medication to paxlovid. I do not have a recent renal function on him and the last one, done in 2018, was on the low/normal side. I don't feel comfortable with prescribing the paxlovid without a check of renal functions. I did send the molnupiravir to [facility] [address] for him.

## 2021-10-31 NOTE — Telephone Encounter (Signed)
Sent prescription for molnupiriavir - he needs to take 4 capsules twice daily for 5 days. I recommend rest and increased fluid. He should inculde vitamins C, D, and Zinc every day to boost immune system. Follow CDC recommendations for isolation and quarantine.

## 2021-10-31 NOTE — Telephone Encounter (Signed)
Called pt he is advised of the Rx that was sent to [facility] and understood that medication could not be change

## 2021-10-31 NOTE — Telephone Encounter (Signed)
Called pt he stated that his pharmacy dose not carry the Rx that was prescribe. He stated that he wanted  the same Rx that was sent in for his wife and he want it to be sent to [facility] on [place]

## 2021-11-28 ENCOUNTER — Ambulatory Visit (INDEPENDENT_AMBULATORY_CARE_PROVIDER_SITE_OTHER): Payer: BC Managed Care – PPO | Admitting: Nurse Practitioner

## 2021-11-28 ENCOUNTER — Other Ambulatory Visit: Payer: Self-pay

## 2021-11-28 ENCOUNTER — Encounter: Payer: Self-pay | Admitting: Nurse Practitioner

## 2021-11-28 VITALS — BP 120/73 | HR 79 | Temp 98.1°F | Ht 73.0 in | Wt 307.6 lb

## 2021-11-28 DIAGNOSIS — J0140 Acute pansinusitis, unspecified: Secondary | ICD-10-CM | POA: Diagnosis not present

## 2021-11-28 DIAGNOSIS — Z6841 Body Mass Index (BMI) 40.0 and over, adult: Secondary | ICD-10-CM | POA: Diagnosis not present

## 2021-11-28 DIAGNOSIS — J040 Acute laryngitis: Secondary | ICD-10-CM

## 2021-11-28 MED ORDER — METHYLPREDNISOLONE 4 MG PO TBPK: ORAL_TABLET | ORAL | 0 refills | Status: DC

## 2021-11-28 MED ORDER — AZITHROMYCIN 250 MG PO TABS: ORAL_TABLET | ORAL | 0 refills | Status: DC

## 2021-11-28 NOTE — Progress Notes (Signed)
Established patient visit   Patient: Thomas Schaefer   DOB: Aug 10, 1981   41 y.o. Male  MRN: [id number] Visit Date: 11/28/2021  Chief Complaint  Patient presents with   Sore Throat   Subjective    States that he had runniny nose with no other symptoms for several weeks. He then started to develop a cough. After two weeks of cough, got sore throat. Sore throat has developed into significant hoarseness. Hard to talk because voice is so hoarse. Patient states that symptoms continue to get worse.   Sore Throat  This is a new problem. The current episode started 1 to 4 weeks ago. The problem has been gradually worsening. Neither side of throat is experiencing more pain than the other. There has been no fever. Associated symptoms include congestion, coughing, headaches, a hoarse voice, a plugged ear sensation and swollen glands. Pertinent negatives include no diarrhea, ear discharge, ear pain, shortness of breath, trouble swallowing or vomiting. He has had exposure to mono. Treatments tried: patient has taken OTC psudophen and muciex D with little t no relief of symptoms. The treatment provided no relief.     Medications: Outpatient Medications Prior to Visit  Medication Sig   allopurinol (ZYLOPRIM) 300 MG tablet Take 1 tablet (300 mg total) by mouth daily.   colchicine 0.6 MG tablet Take 1 tablet (0.6 mg total) by mouth daily as needed.   EPIPEN 2-PAK 0.3 MG/0.3ML SOAJ injection    No facility-administered medications prior to visit.    Review of Systems  Constitutional:  Positive for appetite change, chills and fatigue. Negative for activity change and fever.  HENT:  Positive for congestion, hoarse voice, postnasal drip, rhinorrhea, sinus pressure, sinus pain and sore throat. Negative for ear discharge, ear pain, sneezing and trouble swallowing.   Eyes: Negative.   Respiratory:  Positive for cough and wheezing. Negative for shortness of breath.   Cardiovascular:  Negative for chest pain and  palpitations.  Gastrointestinal:  Negative for constipation, diarrhea, nausea and vomiting.  Endocrine: Negative for cold intolerance, heat intolerance, polydipsia and polyuria.  Genitourinary:  Negative for dysuria, frequency and urgency.  Musculoskeletal:  Negative for back pain and myalgias.  Skin:  Negative for rash.  Allergic/Immunologic: Negative for environmental allergies.  Neurological:  Positive for headaches. Negative for dizziness and weakness.  Hematological:  Positive for adenopathy.  Psychiatric/Behavioral:  The patient is not nervous/anxious.     Objective     Today's Vitals   11/28/21 1505  BP: 120/73  Pulse: 79  Temp: 98.1 F (36.7 C)  SpO2: 98%  Weight: (!) 307 lb 9.6 oz (139.5 kg)  Height: 6\' 1"  (1.854 m)   Body mass index is 40.58 kg/m.   Physical Exam Vitals and nursing note reviewed.  Constitutional:      Appearance: Normal appearance. He is well-developed. He is ill-appearing.  HENT:     Head: Normocephalic and atraumatic.     Right Ear: Tympanic membrane is erythematous and bulging.     Left Ear: Tympanic membrane is erythematous and bulging.     Nose: Congestion and rhinorrhea present. Rhinorrhea is clear.     Right Sinus: Maxillary sinus tenderness and frontal sinus tenderness present.     Left Sinus: Maxillary sinus tenderness and frontal sinus tenderness present.     Mouth/Throat:     Pharynx: Posterior oropharyngeal erythema present.     Tonsils: 1+ on the right. 1+ on the left.  Eyes:     Pupils: Pupils are equal, round,  and reactive to light.  Cardiovascular:     Rate and Rhythm: Normal rate and regular rhythm.     Pulses: Normal pulses.     Heart sounds: Normal heart sounds.  Pulmonary:     Effort: Pulmonary effort is normal.     Breath sounds: Normal breath sounds.  Abdominal:     Palpations: Abdomen is soft.  Musculoskeletal:        General: Normal range of motion.     Cervical back: Normal range of motion and neck supple.   Lymphadenopathy:     Cervical: Cervical adenopathy present.  Skin:    General: Skin is warm and dry.     Capillary Refill: Capillary refill takes less than 2 seconds.  Neurological:     General: No focal deficit present.     Mental Status: He is alert and oriented to person, place, and time.  Psychiatric:        Mood and Affect: Mood normal.        Behavior: Behavior normal.        Thought Content: Thought content normal.        Judgment: Judgment normal.     Assessment & Plan     1. Acute non-recurrent pansinusitis Start Z-Pak.  Take as directed for 5 days.  We will also add Medrol taper.  Take as directed for 6 days. Rest and increase fluids. Continue using OTC medication to control symptoms.   - azithromycin (ZITHROMAX) 250 MG tablet; z-pack - take as directed for 5 days  Dispense: 6 tablet; Refill: 0 - methylPREDNISolone (MEDROL) 4 MG TBPK tablet; Take by mouth as directed for 6 days  Dispense: 21 tablet; Refill: 0  2. Laryngitis Medrol taper should help with pharyngitis.  Recommend warm salt water gargles as needed. - methylPREDNISolone (MEDROL) 4 MG TBPK tablet; Take by mouth as directed for 6 days  Dispense: 21 tablet; Refill: 0  3. Body mass index (BMI) of 40.1-44.9 in adult [facility]) Encourage patient to limit calorie intake to 2000 cal/day or less.  He should consume a low cholesterol, low-fat diet.   No follow-ups on file.        Carlean Jews, NP  [facility] [phone] (phone) [phone] (fax)  [facility]

## 2021-11-28 NOTE — Patient Instructions (Signed)
Please hold any doses of colchicine while taking Z-pack.

## 2021-12-03 DIAGNOSIS — J040 Acute laryngitis: Secondary | ICD-10-CM | POA: Insufficient documentation

## 2021-12-03 DIAGNOSIS — J0140 Acute pansinusitis, unspecified: Secondary | ICD-10-CM | POA: Insufficient documentation

## 2021-12-03 DIAGNOSIS — Z6841 Body Mass Index (BMI) 40.0 and over, adult: Secondary | ICD-10-CM | POA: Insufficient documentation

## 2021-12-21 IMAGING — DX DG FOOT COMPLETE 3+V*R*
3 series · 3 of 3 positions shown · non-contrast
Comparison: None.

CLINICAL DATA: Right foot pain, worse with walking.

EXAM:
RIGHT FOOT COMPLETE - 3+ VIEW

[dg foot 2 views right (1 of 3)]
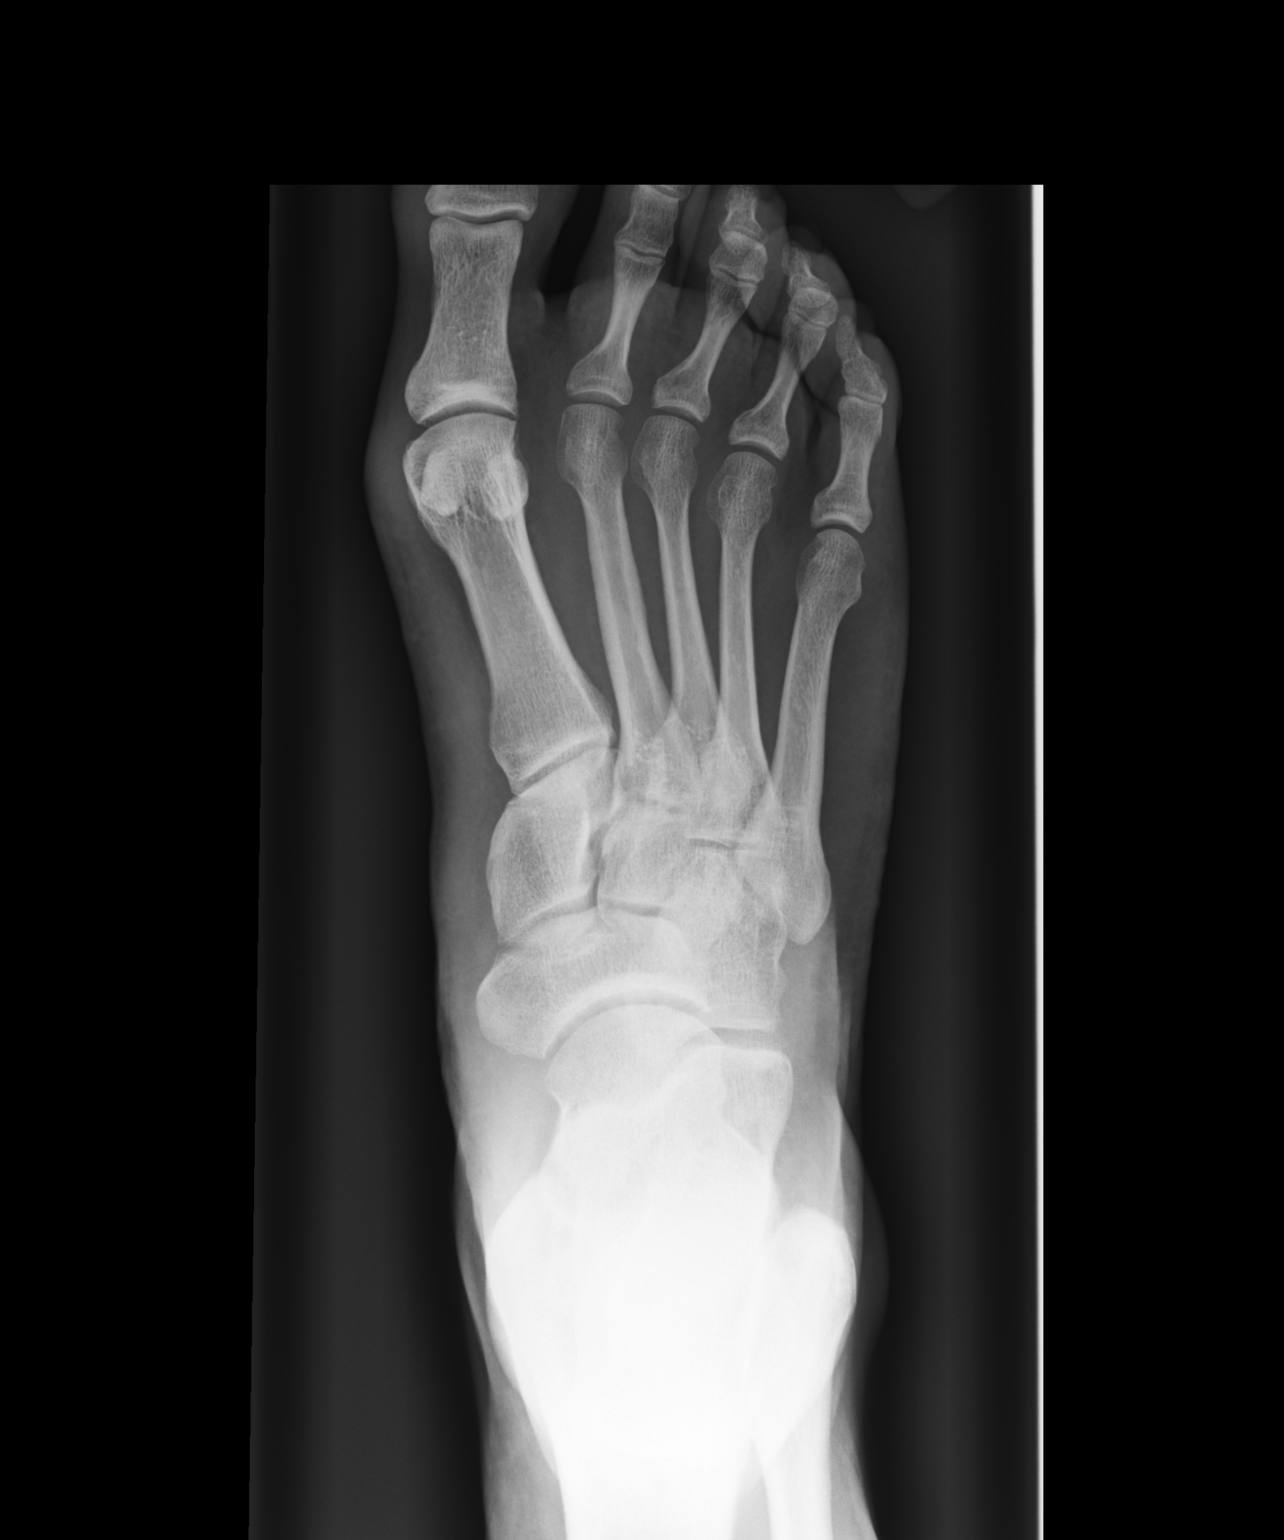

[dg foot 2 views right (2 of 3)]
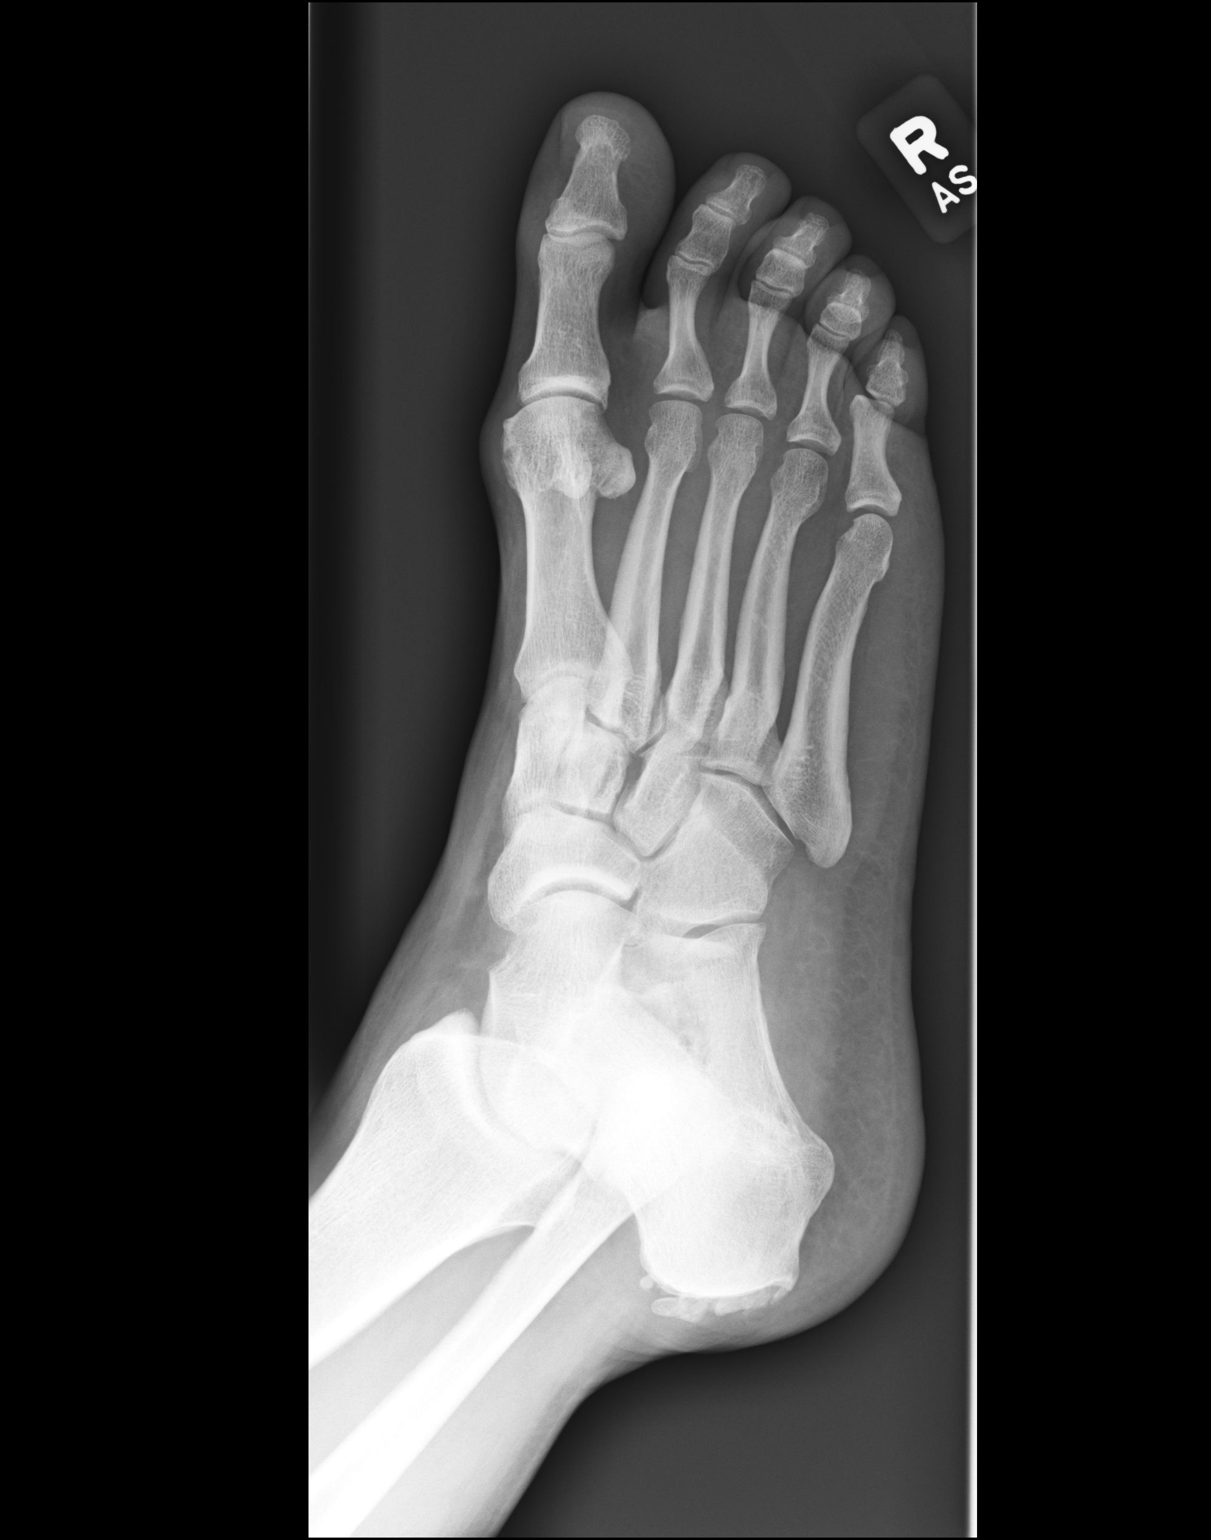

[dg foot 2 views right (3 of 3)]
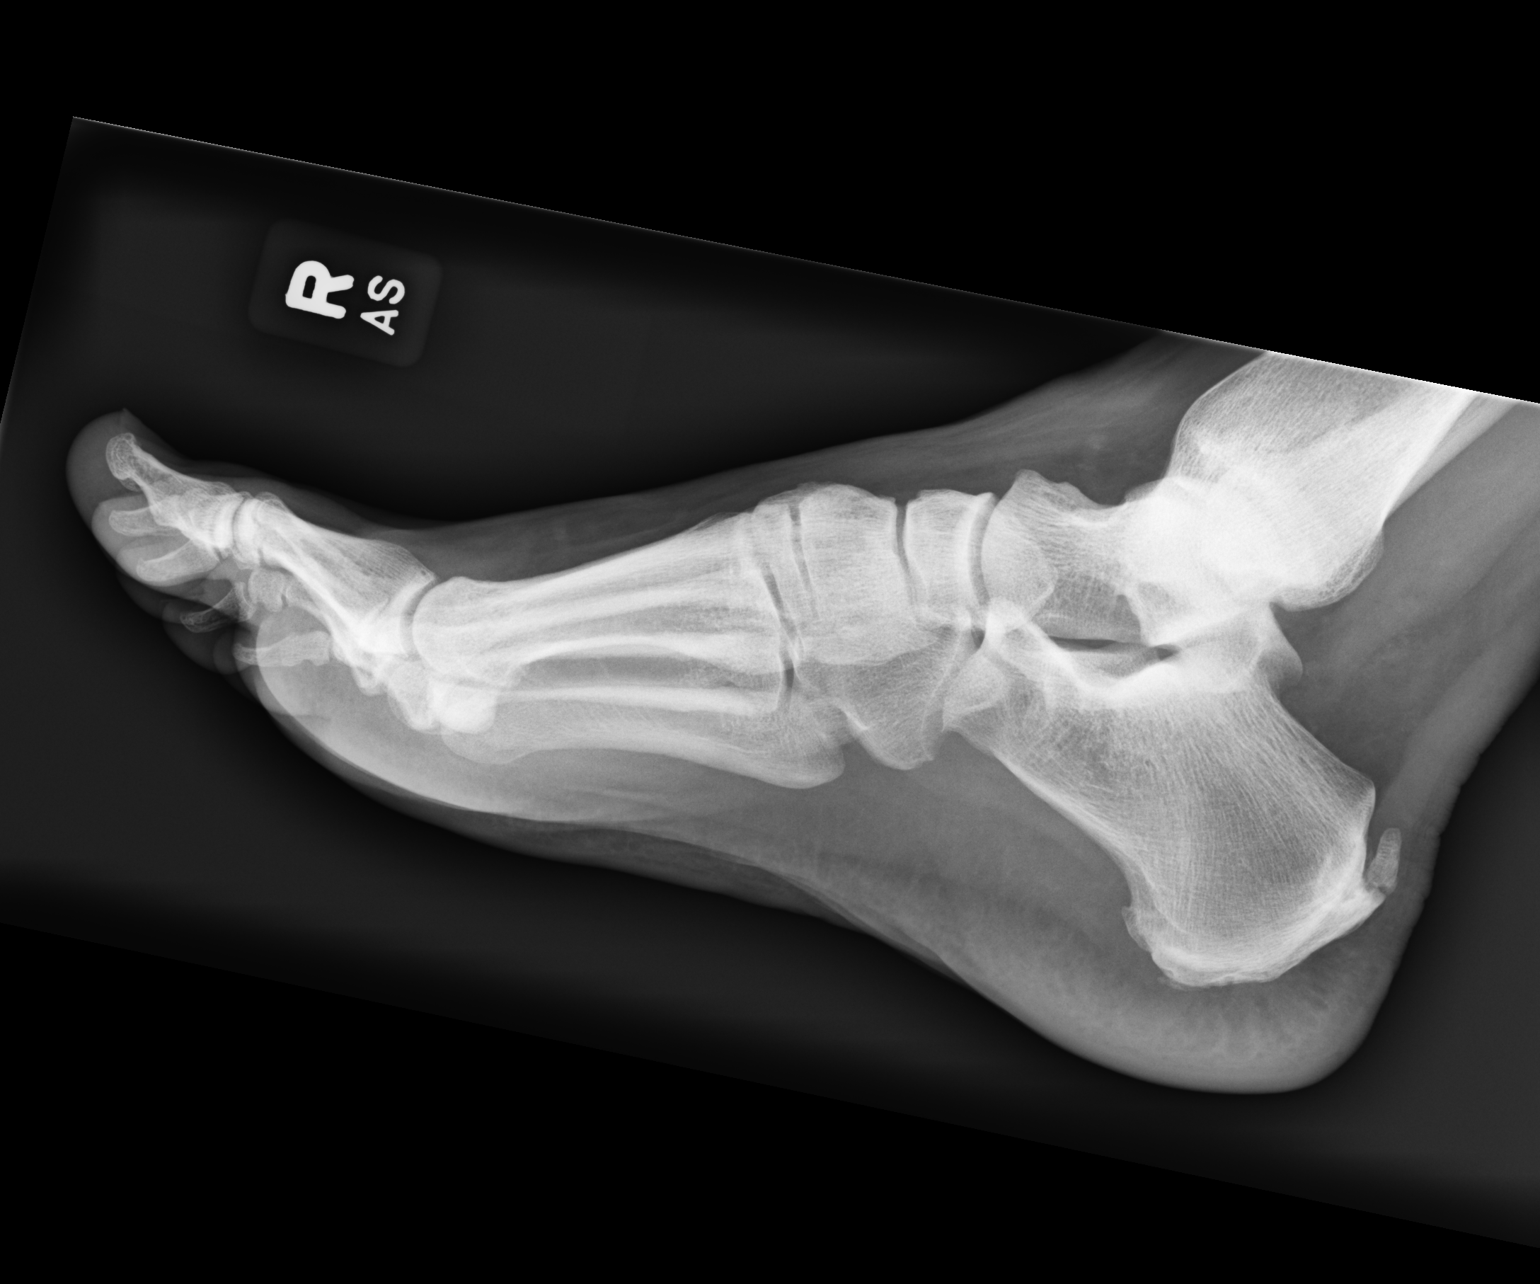

[3 of 3 positions shown; findings below may reference images not displayed]

FINDINGS: Mild hallux valgus with hallux angle of 20 degrees. Minimal
degenerative spurring of the first metatarsal phalangeal joint.
Slight hammertoe deformity of the fourth and fifth digits. There is
minimal spurring at the talonavicular joint and in the midfoot. Tiny
plantar calcaneal spur. Fragmented Achilles tendon enthesophyte. No
evidence of fracture, erosion, periosteal reaction or focal bone
abnormality. Unremarkable soft tissues.
IMPRESSION: 1. Mild hallux valgus with minimal degenerative spurring of the
first metatarsophalangeal joint. Minimal degenerative spurring at
the talonavicular joint and midfoot.
2. Fragmented Achilles tendon enthesophyte. Tiny plantar calcaneal
spur.

## 2022-01-01 ENCOUNTER — Telehealth: Payer: Self-pay

## 2022-01-01 DIAGNOSIS — M79671 Pain in right foot: Secondary | ICD-10-CM

## 2022-01-01 DIAGNOSIS — E790 Hyperuricemia without signs of inflammatory arthritis and tophaceous disease: Secondary | ICD-10-CM

## 2022-01-01 NOTE — Telephone Encounter (Signed)
Patient called requesting a prescription refill of Colchicine to be sent to [facility].   ? ?Patient states he was suppose to take the Colchicine medication 1 tablet in the morning, but has increased to 2 tablets per day (1 in the morning and 1 before bed) due to leg and foot pain.  Patient states he was prescribed a steroid and anti-inflammatory for something else and noticed that it also helped his gout pain.  Patient states since he finishing those medications his pain has increased and now experiencing pain and swelling in the pointer finger of both hands.  Patient states he also takes Meloxicam 7.5 mg which is prescribed by his PCP.   ?

## 2022-01-02 MED ORDER — COLCHICINE 0.6 MG PO TABS: 0.6000 mg | ORAL_TABLET | Freq: Two times a day (BID) | ORAL | 1 refills | Status: DC | PRN

## 2022-01-02 MED ORDER — ALLOPURINOL 300 MG PO TABS: 300.0000 mg | ORAL_TABLET | Freq: Every day | ORAL | 1 refills | Status: DC

## 2022-01-02 NOTE — Telephone Encounter (Signed)
New Rx sent for colchicine. We never followed up last year after starting allopurinol, did he do okay with the medication? ?If he tolerated it okay last year I recommend we restart with 300 mg once daily so he could be on treatment for a month before our follow up in April.

## 2022-01-02 NOTE — Telephone Encounter (Signed)
I called patient, patient verbalized understanding. 

## 2022-01-15 ENCOUNTER — Other Ambulatory Visit: Payer: Self-pay

## 2022-01-15 ENCOUNTER — Ambulatory Visit: Payer: BC Managed Care – PPO | Admitting: Nurse Practitioner

## 2022-01-15 ENCOUNTER — Telehealth: Payer: Self-pay

## 2022-01-15 ENCOUNTER — Encounter: Payer: Self-pay | Admitting: Nurse Practitioner

## 2022-01-15 VITALS — BP 120/71 | HR 88 | Temp 98.2°F | Ht 73.0 in | Wt 311.2 lb

## 2022-01-15 DIAGNOSIS — M064 Inflammatory polyarthropathy: Secondary | ICD-10-CM | POA: Diagnosis not present

## 2022-01-15 DIAGNOSIS — L03119 Cellulitis of unspecified part of limb: Secondary | ICD-10-CM | POA: Diagnosis not present

## 2022-01-15 DIAGNOSIS — M0600 Rheumatoid arthritis without rheumatoid factor, unspecified site: Secondary | ICD-10-CM | POA: Insufficient documentation

## 2022-01-15 DIAGNOSIS — Z6831 Body mass index (BMI) 31.0-31.9, adult: Secondary | ICD-10-CM | POA: Diagnosis not present

## 2022-01-15 MED ORDER — CLARITHROMYCIN 500 MG PO TABS: 500.0000 mg | ORAL_TABLET | Freq: Two times a day (BID) | ORAL | 0 refills | Status: DC

## 2022-01-15 MED ORDER — PREDNISONE 10 MG (48) PO TBPK: ORAL_TABLET | ORAL | 0 refills | Status: DC

## 2022-01-15 NOTE — Telephone Encounter (Signed)
Attempted to contact the patient and left message for patient to call the office.  

## 2022-01-15 NOTE — Progress Notes (Signed)
Established patient visit ? ? ?Patient: Thomas Schaefer   DOB: [birth year]   41 y.o. Male  MRN: [id number] ?Visit Date: 01/15/2022 ? ?Chief Complaint  ?Patient presents with  ? Cellulitis  ? ?Subjective  ?  ?HPI  ?The patient presents for joint pain. Started with swelling and pain in the left foot and ankle. Has gradually spread to multiple joints of the body, including both feet, knees, and hands. He has had intermittent sweats, chills, and myalgias to go along with this. Has been happening for three weeks or longer. He states that the pain he experienced initially in his feet and ankles has subsided. He has stopped taking allopurinol as he wasn't sure if this was causing the generalized joint pain. States that he continues to take colchicine.  ? ?Medications: ?Outpatient Medications Prior to Visit  ?Medication Sig  ? EPIPEN 2-PAK 0.3 MG/0.3ML SOAJ injection   ? meloxicam (MOBIC) 7.5 MG tablet Take 7.5 mg by mouth 2 (two) times daily.  ? [DISCONTINUED] azithromycin (ZITHROMAX) 250 MG tablet z-pack - take as directed for 5 days  ? [DISCONTINUED] colchicine 0.6 MG tablet Take 1 tablet (0.6 mg total) by mouth 2 (two) times daily as needed.  ? [DISCONTINUED] methylPREDNISolone (MEDROL) 4 MG TBPK tablet Take by mouth as directed for 6 days  ? allopurinol (ZYLOPRIM) 300 MG tablet Take 1 tablet (300 mg total) by mouth daily. (Patient not taking: Reported on 01/15/2022)  ? ?No facility-administered medications prior to visit.  ? ? ?Review of Systems  ?Constitutional:  Positive for activity change, diaphoresis and fatigue. Negative for chills and fever.  ?HENT:  Negative for congestion, postnasal drip, rhinorrhea, sinus pressure, sinus pain, sneezing and sore throat.   ?Eyes: Negative.   ?Respiratory:  Negative for cough, shortness of breath and wheezing.   ?Cardiovascular:  Positive for leg swelling. Negative for chest pain and palpitations.  ?Gastrointestinal:  Negative for constipation, diarrhea, nausea and vomiting.   ?Endocrine: Negative for cold intolerance, heat intolerance, polydipsia and polyuria.  ?Genitourinary:  Negative for dysuria, frequency and urgency.  ?Musculoskeletal:  Positive for arthralgias, joint swelling and myalgias. Negative for back pain.  ?Skin:  Negative for rash.  ?Allergic/Immunologic: Negative for environmental allergies.  ?Neurological:  Negative for dizziness, weakness and headaches.  ?Psychiatric/Behavioral:  The patient is not nervous/anxious.   ? ? Objective  ?  ? ?Today's Vitals  ? 01/15/22 1140  ?BP: 120/71  ?Pulse: 88  ?Temp: 98.2 ?F (36.8 ?C)  ?SpO2: 97%  ?Weight: (!) 311 lb 3.2 oz (141.2 kg)  ?Height: 6\' 1"  (1.854 m)  ? ?Body mass index is 41.06 kg/m?.  ? ?Physical Exam ?Vitals and nursing note reviewed.  ?Constitutional:   ?   Appearance: Normal appearance. He is well-developed. He is obese.  ?HENT:  ?   Head: Normocephalic and atraumatic.  ?Eyes:  ?   Extraocular Movements: Extraocular movements intact.  ?   Conjunctiva/sclera: Conjunctivae normal.  ?   Pupils: Pupils are equal, round, and reactive to light.  ?Cardiovascular:  ?   Rate and Rhythm: Normal rate and regular rhythm.  ?   Pulses: Normal pulses.  ?   Heart sounds: Normal heart sounds.  ?Pulmonary:  ?   Effort: Pulmonary effort is normal.  ?   Breath sounds: Normal breath sounds.  ?Abdominal:  ?   Palpations: Abdomen is soft.  ?Musculoskeletal:     ?   General: Normal range of motion.  ?   Cervical back: Normal range of motion and neck  supple.  ?   Right lower leg: Edema present.  ?   Left lower leg: Edema present.  ?   Comments: There is 1+ pitting edema in both lower extremities, most severe at medial aspect of both ankles. These areas are tender to palpate. Bilateral knees also swollen and tender along posterior aspect. No bony abnormalities or deformities noted.   ?Lymphadenopathy:  ?   Cervical: No cervical adenopathy.  ?Skin: ?   General: Skin is warm and dry.  ?   Capillary Refill: Capillary refill takes less than 2  seconds.  ?Neurological:  ?   General: No focal deficit present.  ?   Mental Status: He is alert and oriented to person, place, and time.  ?Psychiatric:     ?   Mood and Affect: Mood normal.     ?   Behavior: Behavior normal.     ?   Thought Content: Thought content normal.     ?   Judgment: Judgment normal.  ?  ? ? Assessment & Plan  ?  ? ?1. Inflammatory polyarthritis (HCC) ?Looking at historical lab results, concern for rheumatoid arthritis. Most recent test for RA factor was 07/18/2021 and was 125. Will start prednisone taper. Take as directed for 12 days. Recommend appointment with rheumatology in near future.  ?- predniSONE (STERAPRED UNI-PAK 48 TAB) 10 MG (48) TBPK tablet; 12 day taper - take by mouth as directed for 12 days  Dispense: 48 tablet; Refill: 0 ? ?2. Cellulitis of lower extremity, unspecified laterality ?Will treat for infection due to systemic symptoms of sweats, chills, and myalgias. Start biaxin 500mg  twice daily for 7 days.  ?- clarithromycin (BIAXIN) 500 MG tablet; Take 1 tablet (500 mg total) by mouth 2 (two) times daily.  Dispense: 14 tablet; Refill: 0 ? ?3. Body mass index (BMI) of 31.0-31.9 in adult ?Encourage patient to limit calorie intake to 2000 cal/day or less.  He should consume a low cholesterol, low-fat diet.  \ ?Return for prn worsening or persistent symptoms.  ?   ? ? ? ?07-14-1969, NP  ?[facility] ?[phone] (phone) ?[phone] (fax) ? ?[facility] ?

## 2022-01-15 NOTE — Telephone Encounter (Signed)
Patient left a voicemail stating "something bad is going on" and requested a return call.   ?

## 2022-01-15 NOTE — Telephone Encounter (Signed)
Patient is having swelling and severe pain in bil ankles, bil. knees and all fingers, warm to the touch, problems since 01/05/2022 (three days after starting allopurinol), constant right sided sciatica pain, saw PCP today, started on prednisone and biaxin, d/c all meds except meds prescribed today, patient wants to know what to do? [facility] [phone]. ?

## 2022-01-15 NOTE — Telephone Encounter (Signed)
Patient left a voicemail stating he was returning Andrea's call.   °

## 2022-01-16 NOTE — Telephone Encounter (Signed)
I called patient, patient has appt 02/07/2022. ?

## 2022-01-16 NOTE — Telephone Encounter (Signed)
Prednisone should be helpful regardless of whether symptoms are from RA or gout. Starting allopurinol can increased some people's risk of gout attack in the short term but his symptoms are unusual for this. We will need to follow up in person for a better look before I can recommend a longer term plan.

## 2022-01-24 ENCOUNTER — Telehealth: Payer: Self-pay | Admitting: Nurse Practitioner

## 2022-01-24 ENCOUNTER — Other Ambulatory Visit: Payer: Self-pay | Admitting: Nurse Practitioner

## 2022-01-24 DIAGNOSIS — M064 Inflammatory polyarthropathy: Secondary | ICD-10-CM

## 2022-01-24 MED ORDER — PREDNISONE 10 MG (48) PO TBPK: ORAL_TABLET | ORAL | 0 refills | Status: DC

## 2022-01-24 NOTE — Telephone Encounter (Signed)
Patient is aware 

## 2022-01-24 NOTE — Telephone Encounter (Signed)
Patient called and said he was put on a steriod taper back for the back/leg pain and it did help him but now that he has completed the medication the pain is back and it is worse. He is asking what should he do? [phone] ?

## 2022-01-24 NOTE — Telephone Encounter (Signed)
Thank you. I have sent new prednisone taper to [facility].

## 2022-01-24 NOTE — Telephone Encounter (Signed)
If he wants, I can do a second round of prednisone. At his visit, I advised that he needed to see his rheumatologist due to the generalized joint pain and swelling. Please ask him if he has done this. Thank you

## 2022-01-24 NOTE — Telephone Encounter (Signed)
No he has not reached out to Rheumatology however I gave him the information and yes can you please send over the prednisone?  ?

## 2022-02-05 ENCOUNTER — Other Ambulatory Visit: Payer: Self-pay

## 2022-02-05 ENCOUNTER — Telehealth: Payer: Self-pay | Admitting: Nurse Practitioner

## 2022-02-05 DIAGNOSIS — M064 Inflammatory polyarthropathy: Secondary | ICD-10-CM

## 2022-02-05 NOTE — Telephone Encounter (Signed)
Patient called stating he ran out of his Prednisone medication and is in a lot of pain.  Patient requested a prescription refill.  Patient states he is scheduled for a follow-up with Dr. Dimple Casey on Wednesday, 02/07/22.   ?

## 2022-02-05 NOTE — Telephone Encounter (Signed)
Patient requesting refill of Prednisone. Please advise. [phone] ?

## 2022-02-05 NOTE — Telephone Encounter (Signed)
Called pt he is advised that he has an upcoming appt with his Rheumatology on 02/07/2022  ?

## 2022-02-06 NOTE — Telephone Encounter (Signed)
LMOM

## 2022-02-06 NOTE — Telephone Encounter (Signed)
LMOM prednisone taper sent 01/24/2022 by Dr. Leretha Pol, patient has f/u 02/07/2022, does patient need another taper? ?

## 2022-02-06 NOTE — Progress Notes (Signed)
? ?Office Visit Note ? ?Patient: Thomas Schaefer             ?Date of Birth: [birth year]           ?MRN: [id number]             ?PCP: Ronnell Freshwater, NP ?Referring: Ronnell Freshwater, NP ?Visit Date: 02/07/2022 ? ? ?Subjective:  ?Follow-up (Right left leg pain and swelling  from hip to knee) ? ? ?History of Present Illness: Thomas Schaefer is a 41 y.o. male here for follow up for joint inflammation previously seen in October initial plan to treat gouty arthritis not clear if also having RA with a positive RF. He has suffered multiple and persistent joint inflammation treated with colchicine and multiple prednisone courses. He started taking allopurinol last month and within days developed severe pain and stiffness in his right knee and some pain extending up to the right hip posteriorly. Pain is intermittent and stabbing, and he cannot fully extend his knee. Symptoms are not similar to anything that he had in the past. He took 2 12 day prednisone tapers these improved symptoms but came back again once down to about 20 mg daily dose. He stopped taking colchicine in accordance with directions when prescribed the prednisone. ? ? ?Previous HPI ?08/21/21 ?Thomas Schaefer is a 41 y.o. male here for foot pain and inflammation with elevated rheumatoid factor and uric acid.  Symptoms started since around July of this year with pain on the right foot worse at the bottom of the foot and the smaller for toes.  Initial treatment with colchicine had partial benefit in symptoms.  There is also considerable initial swelling.  He received a short course of oral steroids with more symptom improvement but this persisted. He saw Dr. Milinda Pointer in September treatment with local steroid infection and oral prednisone taper and blood tests were checked.  Inflammatory markers were normal but rheumatoid factor and uric acid were significantly elevated.  Xray of the foot has shown soft tissue edema without significant osseus abnormalities.  After  the injection and additional medication symptoms again improved at this point he is having persistent mild daily symptoms with some pain worsened with pressure and weight on the ball of his foot without much active swelling. ?He has had some chronic pain and instability of the right knee for a much longer time without any identified problem, xrays have been normal.  He does not drink much alcohol regularly.  He used to drink sodas very heavily but has already decreased this to regular but small amounts before any onset of the symptoms. ?  ?Labs reviewed ?06/2021 ?ANA neg ?RF 125 ?Uric acid 11.1 ?ESR 6 ?CRP 3.0 ? ? ?Review of Systems  ?Constitutional:  Positive for fatigue.  ?HENT:  Negative for mouth dryness.   ?Eyes:  Negative for dryness.  ?Respiratory:  Negative for shortness of breath.   ?Cardiovascular:  Positive for swelling in legs/feet.  ?Gastrointestinal:  Negative for constipation.  ?Endocrine: Positive for excessive thirst.  ?Genitourinary:  Negative for difficulty urinating.  ?Musculoskeletal:  Positive for joint pain, gait problem, joint pain, joint swelling and muscle tenderness.  ?Skin:  Negative for rash.  ?Allergic/Immunologic: Negative for susceptible to infections.  ?Neurological:  Negative for numbness.  ?Hematological:  Negative for bruising/bleeding tendency.  ?Psychiatric/Behavioral:  Positive for sleep disturbance.   ? ?PMFS History:  ?Patient Active Problem List  ? Diagnosis Date Noted  ? Effusion, right knee 02/07/2022  ? Inflammatory polyarthritis ([place]) 01/15/2022  ?  Cellulitis of lower extremity 01/15/2022  ? Body mass index (BMI) of 31.0-31.9 in adult 01/15/2022  ? Acute non-recurrent pansinusitis 12/03/2021  ? Laryngitis 12/03/2021  ? Body mass index (BMI) of 40.1-44.9 in adult [facility]) 12/03/2021  ? Hyperuricemia 08/21/2021  ? Encounter to establish care 05/14/2021  ? Right foot pain 05/14/2021  ? Obesity 07/13/2015  ? Mood disorder ([place]) 07/13/2015  ? ALLERGIC RHINITIS 06/06/2007  ? GERD  06/06/2007  ? IRRITABLE BOWEL SYNDROME 06/06/2007  ?  ?Past Medical History:  ?Diagnosis Date  ? GERD (gastroesophageal reflux disease)   ? Hyperlipidemia   ?  ?Family History  ?Problem Relation Age of Onset  ? Mental illness Mother   ? Drug abuse Mother   ? Diabetes Father   ? Alcohol abuse Father   ? Stroke Father   ? Alcohol abuse Maternal Uncle   ? ?Past Surgical History:  ?Procedure Laterality Date  ? TOE SURGERY Left   ? VASECTOMY    ? ?Social History  ? ?Social History Narrative  ? Not on file  ? ?Immunization History  ?Administered Date(s) Administered  ? Moderna Sars-Covid-2 Vaccination 11/04/2019, 12/03/2019  ?  ? ?Objective: ?Vital Signs: BP 118/72 (BP Location: Left Arm, Patient Position: Sitting, Cuff Size: Normal)   Pulse 97   Resp 16   Ht $R'6\' 1"'Hj$  (1.854 m)   Wt (!) 308 lb (139.7 kg)   BMI 40.64 kg/m?   ? ?Physical Exam ?Constitutional:   ?   Appearance: He is obese.  ?Cardiovascular:  ?   Rate and Rhythm: Normal rate and regular rhythm.  ?Pulmonary:  ?   Effort: Pulmonary effort is normal.  ?   Breath sounds: Normal breath sounds.  ?Musculoskeletal:  ?   Right lower leg: No edema.  ?   Left lower leg: No edema.  ?Skin: ?   General: Skin is warm and dry.  ?   Findings: No rash.  ?Neurological:  ?   Mental Status: He is alert.  ?Psychiatric:     ?   Mood and Affect: Mood normal.  ?  ? ?Musculoskeletal Exam:  ?Wrists full ROM no tenderness or swelling ?Fingers full ROM no tenderness or swelling ?Right knee warmth, swelling anterior and posterior, positive patellar ballotment, decreased active and passive extension ROM ?Ankles full ROM no tenderness or swelling ? ? ?Investigation: ?No additional findings. ? ?Imaging: ?No results found. ? ?Recent Labs: ?Lab Results  ?Component Value Date  ? WBC 7.8 07/18/2021  ? HGB 15.7 07/18/2021  ? PLT 248 07/18/2021  ? NA 138 06/28/2017  ? K 3.8 06/28/2017  ? CL 103 06/28/2017  ? CO2 29 06/28/2017  ? GLUCOSE 93 06/28/2017  ? BUN 10 06/28/2017  ? CREATININE 1.12  06/28/2017  ? BILITOT 0.7 07/11/2015  ? ALKPHOS 64 07/11/2015  ? AST 26 07/11/2015  ? ALT 32 07/11/2015  ? PROT 6.9 07/11/2015  ? ALBUMIN 4.0 07/11/2015  ? CALCIUM 9.1 06/28/2017  ? GFRAA >60 06/24/2017  ? ? ?Speciality Comments: No specialty comments available. ? ?Procedures:  ?Large Joint Inj: R knee on 02/07/2022 2:30 PM ?Details: 22 G 1.5 in needle, superolateral approach ?Medications: 4 mL lidocaine 1 % ?Aspirate: 35 mL yellow; sent for lab analysis ?Outcome: tolerated well, no immediate complications ?Procedure, treatment alternatives, risks and benefits explained, specific risks discussed. Consent was given by the patient. Immediately prior to procedure a time out was called to verify the correct patient, procedure, equipment, support staff and site/side marked as required. Patient was  prepped and draped in the usual sterile fashion.  ? ? ?Allergies: Bee venom  ? ?Assessment / Plan:     ?Visit Diagnoses: Inflammatory polyarthritis ([place]) - Plan: predniSONE (DELTASONE) 10 MG tablet, Synovial Fluid Analysis, Complete ? ?Foot inflammation appears improved, now right knee is causing his problems. Pain in the back of the thigh and up to the hip I suspect also related to the knee. Inflammatory monoarthritis most suspicious for gout attack sending fluid for analysis to confirm as he has labs could be consistent with new RA as well. Restarting with new prednisone taper for 1 week also intraarticular 40 mg triamcinolone today. ? ?Effusion, right knee - Plan: Synovial Fluid Analysis, Complete ? ?Aspiration and injectio today as above, knee mobility partially improved immediately after drainage. Some posterior swelling likely baker's cyst as well. ? ?Orders: ?Orders Placed This Encounter  ?Procedures  ? Large Joint Inj  ? Synovial Fluid Analysis, Complete  ? ?Meds ordered this encounter  ?Medications  ? predniSONE (DELTASONE) 10 MG tablet  ?  Sig: Take by mouth once daily: 6, 5, 4, 3, 2 , then 1 tablet daily  ?  Dispense:   21 tablet  ?  Refill:  0  ? ? ? ?Follow-Up Instructions: No follow-ups on file. ? ? ?Collier Salina, MD ? ?Note - This record has been created using [employer].  ?Chart creation errors have been sou

## 2022-02-06 NOTE — Telephone Encounter (Signed)
I would prefer to wait and see him to take another look tomorrow, it was somewhat unclear whether gout or RA was causing his symptoms at our last visit in October.

## 2022-02-07 ENCOUNTER — Ambulatory Visit: Payer: BC Managed Care – PPO | Admitting: Internal Medicine

## 2022-02-07 ENCOUNTER — Encounter: Payer: Self-pay | Admitting: Internal Medicine

## 2022-02-07 VITALS — BP 118/72 | HR 97 | Resp 16 | Ht 73.0 in | Wt 308.0 lb

## 2022-02-07 DIAGNOSIS — M25461 Effusion, right knee: Secondary | ICD-10-CM

## 2022-02-07 DIAGNOSIS — M064 Inflammatory polyarthropathy: Secondary | ICD-10-CM | POA: Diagnosis not present

## 2022-02-07 LAB — SYNOVIAL FLUID ANALYSIS, COMPLETE
Basophils, %: 0 %
Eosinophils-Synovial: 0 % (ref 0–2)
Lymphocytes-Synovial Fld: 5 % (ref 0–74)
Monocyte/Macrophage: 6 % (ref 0–69)
Neutrophil, Synovial: 89 % — ABNORMAL HIGH (ref 0–24)
Synoviocytes, %: 0 % (ref 0–15)
WBC, Synovial: 20700 cells/uL — ABNORMAL HIGH (ref <150)

## 2022-02-07 MED ORDER — PREDNISONE 10 MG PO TABS: ORAL_TABLET | ORAL | 0 refills | Status: DC

## 2022-02-07 MED ORDER — LIDOCAINE HCL 1 % IJ SOLN
4.0000 mL | INTRAMUSCULAR | Status: AC | PRN
  Administered 2022-02-07: 4 mL

## 2022-02-19 ENCOUNTER — Telehealth: Payer: Self-pay | Admitting: Internal Medicine

## 2022-02-19 NOTE — Telephone Encounter (Signed)
Please contact him to scheduled a follow up appointment in about 2 weeks. ? ?FYI- I spoke with Thomas Schaefer about his result fluid is inflammatory but no gout crystals are present. He can stay off the allopurinol and colchicine for now.

## 2022-02-19 NOTE — Telephone Encounter (Signed)
Patient called the office stating he had an appointment on 4/12 and has not heard anything back regarding the fluid that was taken from his knee. Patient states he does not know what the next steps are and requests a call back.  ?

## 2022-02-27 ENCOUNTER — Ambulatory Visit: Payer: BC Managed Care – PPO | Admitting: Internal Medicine

## 2022-02-27 ENCOUNTER — Encounter: Payer: Self-pay | Admitting: Internal Medicine

## 2022-02-27 VITALS — BP 109/69 | HR 68 | Resp 16 | Ht 73.0 in | Wt 299.0 lb

## 2022-02-27 DIAGNOSIS — M25461 Effusion, right knee: Secondary | ICD-10-CM

## 2022-02-27 DIAGNOSIS — E790 Hyperuricemia without signs of inflammatory arthritis and tophaceous disease: Secondary | ICD-10-CM

## 2022-02-27 DIAGNOSIS — M064 Inflammatory polyarthropathy: Secondary | ICD-10-CM | POA: Diagnosis not present

## 2022-02-27 DIAGNOSIS — M25572 Pain in left ankle and joints of left foot: Secondary | ICD-10-CM | POA: Diagnosis not present

## 2022-02-27 MED ORDER — PREDNISONE 10 MG PO TABS: ORAL_TABLET | ORAL | 0 refills | Status: DC

## 2022-02-27 MED ORDER — SULFASALAZINE 500 MG PO TABS: ORAL_TABLET | ORAL | 1 refills | Status: DC

## 2022-02-27 NOTE — Patient Instructions (Signed)
Sulfasalazine Tablets ?What is this medication? ?SULFASALAZINE (sul fa SAL a zeen) treats ulcerative colitis. It works by decreasing inflammation. It belongs to a group of medications called salicylates. ?This medicine may be used for other purposes; ask your health care provider or pharmacist if you have questions. ?COMMON BRAND NAME(S): Azulfidine, Sulfazine ?What should I tell my care team before I take this medication? ?They need to know if you have any of these conditions: ?Asthma ?Blood disorders or anemia ?Glucose-6-phosphate dehydrogenase (G6PD) deficiency ?Intestinal obstruction ?Kidney disease ?Liver disease ?Porphyria ?Urinary tract obstruction ?An unusual reaction to sulfasalazine, sulfa medications, salicylates, or other medications, foods, dyes, or preservatives ?Pregnant or trying to get pregnant ?Breast-feeding ?How should I use this medication? ?Take this medication by mouth with a full glass of water. Take it as directed on the prescription label at the same time every day. You can take it with or without food. If it upsets your stomach, take it with food. Keep taking it unless your care team tells you to stop. ?Talk to your care team about the use of this medication in children. While this medication may be prescribed for children as young as 6 years for selected conditions, precautions do apply. ?Patients over 12 years old may have a stronger reaction and need a smaller dose. ?Overdosage: If you think you have taken too much of this medicine contact a poison control center or emergency room at once. ?NOTE: This medicine is only for you. Do not share this medicine with others. ?What if I miss a dose? ?If you miss a dose, take it as soon as you can. If it is almost time for your next dose, take only that dose. Do not take double or extra doses. ?What may interact with this medication? ?Digoxin ?Folic acid ?This list may not describe all possible interactions. Give your health care provider a list  of all the medicines, herbs, non-prescription drugs, or dietary supplements you use. Also tell them if you smoke, drink alcohol, or use illegal drugs. Some items may interact with your medicine. ?What should I watch for while using this medication? ?Visit your care team for regular checks on your progress. Tell your care team if your symptoms do not start to get better or if they get worse. ?You will need frequent blood and urine checks. ?This medication can make you more sensitive to the sun. Keep out of the sun. If you cannot avoid being in the sun, wear protective clothing and use sunscreen. Do not use sun lamps or tanning beds/booths. ?Drink plenty of water while taking this medication. ?What side effects may I notice from receiving this medication? ?Side effects that you should report to your care team as soon as possible: ?Allergic reactions--skin rash, itching, hives, swelling of the face, lips, tongue, or throat ?Aplastic anemia--unusual weakness or fatigue, dizziness, headache, trouble breathing, increased bleeding or bruising ?Dry cough, shortness of breath or trouble breathing ?Heart muscle inflammation--unusual weakness or fatigue, shortness of breath, chest pain, fast or irregular heartbeat, dizziness, swelling of the ankles, feet, or hands ?Infection--fever, chills, cough, sore throat, wounds that don't heal, pain or trouble when passing urine, general feeling of discomfort or being unwell ?Kidney injury--decrease in the amount of urine, swelling of the ankles, hands, or feet ?Liver injury--right upper belly pain, loss of appetite, nausea, light-colored stool, dark yellow or brown urine, yellowing skin or eyes, unusual weakness or fatigue ?Rash, fever, and swollen lymph nodes ?Redness, blistering, peeling, or loosening of the skin, including inside  the mouth ?Side effects that usually do not require medical attention (report to your care team if they continue or are bothersome): ?Dark yellow or orange  saliva, sweat, or urine ?Dizziness ?Headache ?Loss of appetite ?Nausea ?Upset stomach ?Vomiting ?This list may not describe all possible side effects. Call your doctor for medical advice about side effects. You may report side effects to FDA at 1-800-FDA-1088. ?Where should I keep my medication? ?Keep out of the reach of children and pets. ?Store at room temperature between 15 and 30 degrees C (59 and 86 degrees F). Get rid of any unused medication after the expiration date. ?To get rid of medications that are no longer needed or have expired: ?Take the medications to a medication take-back program. Check with your pharmacy or law enforcement to find a location. ?If you cannot return the medication, check the label or package insert to see if the medication should be thrown out in the garbage or flushed down the toilet. If you are not sure, ask your care team. If it is safe to put it in the trash, take the medication out of the container. Mix the medication with cat litter, dirt, coffee grounds, or other unwanted substance. Seal the mixture in a bag or container. Put it in the trash. ?NOTE: This sheet is a summary. It may not cover all possible information. If you have questions about this medicine, talk to your doctor, pharmacist, or health care provider. ?? 2023 Elsevier/Gold Standard (2021-09-07 00:00:00) ? ?

## 2022-02-27 NOTE — Progress Notes (Signed)
? ?Office Visit Note ? ?Patient: Thomas Schaefer             ?Date of Birth: [birth year]           ?MRN: [id number]             ?PCP: Ronnell Freshwater, NP ?Referring: Ronnell Freshwater, NP ?Visit Date: 02/27/2022 ? ? ?Subjective:  ?Follow-up (Left ankle pain and swelling) ? ? ?History of Present Illness: Richardson Dubree is a 41 y.o. male here for follow up for joint inflammation. His knee improved greatly after aspiration and with prednisone taper but since 4 days ago left ankle pain and inflammation started. Pain is worst first thing in the morning and when standing up gets partially better when up and walking around. He is not taking anything for this currently  ? ?Previous HPI ?02/07/22 ?Manford Sprong is a 41 y.o. male here for follow up for joint inflammation previously seen in October initial plan to treat gouty arthritis not clear if also having RA with a positive RF. He has suffered multiple and persistent joint inflammation treated with colchicine and multiple prednisone courses. He started taking allopurinol last month and within days developed severe pain and stiffness in his right knee and some pain extending up to the right hip posteriorly. Pain is intermittent and stabbing, and he cannot fully extend his knee. Symptoms are not similar to anything that he had in the past. He took 2 12 day prednisone tapers these improved symptoms but came back again once down to about 20 mg daily dose. He stopped taking colchicine in accordance with directions when prescribed the prednisone. ?  ?Previous HPI ?08/21/21 ?Denny Mccree is a 41 y.o. male here for foot pain and inflammation with elevated rheumatoid factor and uric acid.  Symptoms started since around July of this year with pain on the right foot worse at the bottom of the foot and the smaller for toes. Initial treatment with colchicine had partial benefit in symptoms.  There is also considerable initial swelling.  He received a short course of oral steroids  with more symptom improvement but this persisted. He saw Dr. Milinda Pointer in September treatment with local steroid infection and oral prednisone taper and blood tests were checked.  Inflammatory markers were normal but rheumatoid factor and uric acid were significantly elevated.  Xray of the foot has shown soft tissue edema without significant osseus abnormalities.  After the injection and additional medication symptoms again improved at this point he is having persistent mild daily symptoms with some pain worsened with pressure and weight on the ball of his foot without much active swelling. ?He has had some chronic pain and instability of the right knee for a much longer time without any identified problem, xrays have been normal.  He does not drink much alcohol regularly.  He used to drink sodas very heavily but has already decreased this to regular but small amounts before any onset of the symptoms. ?  ?Labs reviewed ?06/2021 ?ANA neg ?RF 125 ?Uric acid 11.1 ?ESR 6 ?CRP 3.0 ? ? ?Review of Systems  ?Constitutional:  Negative for fatigue.  ?HENT:  Negative for mouth dryness.   ?Eyes:  Negative for dryness.  ?Respiratory:  Negative for shortness of breath.   ?Cardiovascular:  Positive for swelling in legs/feet.  ?Gastrointestinal:  Negative for constipation.  ?Endocrine: Negative for excessive thirst.  ?Genitourinary:  Negative for difficulty urinating.  ?Musculoskeletal:  Positive for joint pain, gait problem, joint pain, joint swelling and morning  stiffness.  ?Skin:  Positive for rash.  ?Allergic/Immunologic: Negative for susceptible to infections.  ?Neurological:  Negative for numbness.  ?Hematological:  Negative for bruising/bleeding tendency.  ?Psychiatric/Behavioral:  Positive for sleep disturbance.   ? ?PMFS History:  ?Patient Active Problem List  ? Diagnosis Date Noted  ? Pain in left ankle and joints of left foot 02/27/2022  ? Effusion, right knee 02/07/2022  ? Inflammatory polyarthritis ([place]) 01/15/2022  ?  Cellulitis of lower extremity 01/15/2022  ? Body mass index (BMI) of 31.0-31.9 in adult 01/15/2022  ? Acute non-recurrent pansinusitis 12/03/2021  ? Laryngitis 12/03/2021  ? Body mass index (BMI) of 40.1-44.9 in adult [facility]) 12/03/2021  ? Hyperuricemia 08/21/2021  ? Encounter to establish care 05/14/2021  ? Right foot pain 05/14/2021  ? Obesity 07/13/2015  ? Mood disorder ([place]) 07/13/2015  ? ALLERGIC RHINITIS 06/06/2007  ? GERD 06/06/2007  ? IRRITABLE BOWEL SYNDROME 06/06/2007  ?  ?Past Medical History:  ?Diagnosis Date  ? GERD (gastroesophageal reflux disease)   ? Hyperlipidemia   ? Inflammatory polyarthritis ([place])   ?  ?Family History  ?Problem Relation Age of Onset  ? Mental illness Mother   ? Drug abuse Mother   ? Diabetes Father   ? Alcohol abuse Father   ? Stroke Father   ? Alcohol abuse Maternal Uncle   ? ?Past Surgical History:  ?Procedure Laterality Date  ? TOE SURGERY Left   ? VASECTOMY    ? ?Social History  ? ?Social History Narrative  ? Not on file  ? ?Immunization History  ?Administered Date(s) Administered  ? Moderna Sars-Covid-2 Vaccination 11/04/2019, 12/03/2019  ?  ? ?Objective: ?Vital Signs: BP 109/69 (BP Location: Right Arm, Patient Position: Sitting, Cuff Size: Normal)   Pulse 68   Resp 16   Ht 6' 1" (1.854 m)   Wt 299 lb (135.6 kg)   BMI 39.45 kg/m?   ? ?Physical Exam ?Constitutional:   ?   Appearance: He is obese.  ?Musculoskeletal:  ?   Right lower leg: No edema.  ?   Left lower leg: No edema.  ?Neurological:  ?   Mental Status: He is alert.  ?Psychiatric:     ?   Mood and Affect: Mood normal.  ?  ? ?Musculoskeletal Exam:  ?Wrists full ROM no tenderness or swelling ?Fingers full ROM no tenderness or swelling ?Knees full ROM no tenderness or swelling ?Left ankle swelling extending around all sides, tenderness localized to achilles insertion ?MTPs full ROM no tenderness or swelling ? ? ?Investigation: ?No additional findings. ? ?Imaging: ?No results found. ? ?Recent Labs: ?Lab Results   ?Component Value Date  ? WBC 7.8 07/18/2021  ? HGB 15.7 07/18/2021  ? PLT 248 07/18/2021  ? NA 138 06/28/2017  ? K 3.8 06/28/2017  ? CL 103 06/28/2017  ? CO2 29 06/28/2017  ? GLUCOSE 93 06/28/2017  ? BUN 10 06/28/2017  ? CREATININE 1.12 06/28/2017  ? BILITOT 0.7 07/11/2015  ? ALKPHOS 64 07/11/2015  ? AST 26 07/11/2015  ? ALT 32 07/11/2015  ? PROT 6.9 07/11/2015  ? ALBUMIN 4.0 07/11/2015  ? CALCIUM 9.1 06/28/2017  ? GFRAA >60 06/24/2017  ? ? ?Speciality Comments: No specialty comments available. ? ?Procedures:  ?No procedures performed ?Allergies: Bee venom  ? ?Assessment / Plan:     ?Visit Diagnoses: Inflammatory polyarthritis ([place]) - Plan: sulfaSALAzine (AZULFIDINE) 500 MG tablet, predniSONE (DELTASONE) 10 MG tablet ? ?Inflammation now more in the ankle cannot truly exclude crystalline arthropathy but with negative  findings and a pretty likely alternate cause will recommend initial treatment for rheumatoid arthritis.  Based on his current oligoarticular or migratory monoarticular joint involvement and distribution will recommend starting sulfasalazine quickly titrating up to 1000 mg twice daily over the next month.  We will also repeat 6-day prednisone taper for his current episode to decrease symptoms help him avoid loss of productivity. ? ?Effusion, right knee ?Pain in left ankle and joints of left foot ? ?Knee is doing better after the recent local aspiration. ? ?Hyperuricemia ? ?Lack of crystals on synovial fluid analysis making gout as a cause questionable.  Uric acid was considerably elevated on labs from last year at 11.1.  Discussed whether we should rechallenge if he fails to improve with current treatment plan. ? ? ?Orders: ?No orders of the defined types were placed in this encounter. ? ?Meds ordered this encounter  ?Medications  ? sulfaSALAzine (AZULFIDINE) 500 MG tablet  ?  Sig: Take 1 tablet AM 1 tablet PM for 1 week, then 2 tablets AM 1 tablet PM for 1 week, then 2 tablets twice daily  ?  Dispense:   90 tablet  ?  Refill:  1  ? predniSONE (DELTASONE) 10 MG tablet  ?  Sig: Take by mouth once daily: 6, 5, 4, 3, 2 , then 1 tablet daily  ?  Dispense:  21 tablet  ?  Refill:  0  ? ? ? ?Follow-Up Instructions: Return i

## 2022-03-07 ENCOUNTER — Ambulatory Visit: Payer: BC Managed Care – PPO | Admitting: Internal Medicine

## 2022-03-13 NOTE — Progress Notes (Signed)
Office Visit Note  Patient: Thomas Schaefer             Date of Birth: 02-01-1981           MRN: [id number]             PCP: Ronnell Freshwater, NP Referring: Ronnell Freshwater, NP Visit Date: 03/14/2022   Subjective:  Pain and Edema of the Right Knee and Edema and Pain of the Left Ankle   History of Present Illness: Thomas Schaefer is a 41 y.o. male here for follow up for joint inflammation.  Shortly after completing most recent steroids he has redeveloped increased swelling of the right knee and swelling and pain in the left ankle.  He noticed the most recent steroid treatment improved his swelling but the left ankle pain was not dramatically improved.  So far he has not seen an obvious difference in swelling while taking the sulfasalazine.  Previous HPI 02/27/2022 Thomas Schaefer is a 41 y.o. male here for follow up for joint inflammation. His knee improved greatly after aspiration and with prednisone taper but since 4 days ago left ankle pain and inflammation started. Pain is worst first thing in the morning and when standing up gets partially better when up and walking around. He is not taking anything for this currently    Previous HPI 02/07/22 Thomas Schaefer is a 41 y.o. male here for follow up for joint inflammation previously seen in October initial plan to treat gouty arthritis not clear if also having RA with a positive RF. He has suffered multiple and persistent joint inflammation treated with colchicine and multiple prednisone courses. He started taking allopurinol last month and within days developed severe pain and stiffness in his right knee and some pain extending up to the right hip posteriorly. Pain is intermittent and stabbing, and he cannot fully extend his knee. Symptoms are not similar to anything that he had in the past. He took 2 12 day prednisone tapers these improved symptoms but came back again once down to about 20 mg daily dose. He stopped taking colchicine in  accordance with directions when prescribed the prednisone.   Previous HPI 08/21/21 Thomas Schaefer is a 41 y.o. male here for foot pain and inflammation with elevated rheumatoid factor and uric acid.  Symptoms started since around July of this year with pain on the right foot worse at the bottom of the foot and the smaller for toes. Initial treatment with colchicine had partial benefit in symptoms.  There is also considerable initial swelling.  He received a short course of oral steroids with more symptom improvement but this persisted. He saw Dr. Milinda Pointer in September treatment with local steroid infection and oral prednisone taper and blood tests were checked.  Inflammatory markers were normal but rheumatoid factor and uric acid were significantly elevated.  Xray of the foot has shown soft tissue edema without significant osseus abnormalities.  After the injection and additional medication symptoms again improved at this point he is having persistent mild daily symptoms with some pain worsened with pressure and weight on the ball of his foot without much active swelling. He has had some chronic pain and instability of the right knee for a much longer time without any identified problem, xrays have been normal.  He does not drink much alcohol regularly.  He used to drink sodas very heavily but has already decreased this to regular but small amounts before any onset of the symptoms.   Labs reviewed  06/2021 ANA neg RF 125 Uric acid 11.1 ESR 6 CRP 3.0   Review of Systems  Constitutional:  Positive for fatigue.  HENT:  Negative for mouth dryness.   Eyes:  Negative for dryness.  Respiratory:  Negative for shortness of breath.   Cardiovascular:  Positive for swelling in legs/feet.  Gastrointestinal:  Negative for constipation.  Endocrine: Negative for excessive thirst.  Genitourinary:  Negative for difficulty urinating.  Musculoskeletal:  Positive for joint pain, joint pain, joint swelling and  morning stiffness.  Skin:  Negative for rash.  Allergic/Immunologic: Negative for susceptible to infections.  Neurological:  Negative for weakness.  Hematological:  Negative for bruising/bleeding tendency.  Psychiatric/Behavioral:  Negative for sleep disturbance.    PMFS History:  Patient Active Problem List   Diagnosis Date Noted   Pain in left ankle and joints of left foot 02/27/2022   Effusion, right knee 02/07/2022   Inflammatory polyarthritis ([place]) 01/15/2022   Cellulitis of lower extremity 01/15/2022   Body mass index (BMI) of 31.0-31.9 in adult 01/15/2022   Acute non-recurrent pansinusitis 12/03/2021   Laryngitis 12/03/2021   Body mass index (BMI) of 40.1-44.9 in adult [facility]) 12/03/2021   Hyperuricemia 08/21/2021   Encounter to establish care 05/14/2021   Right foot pain 05/14/2021   Obesity 07/13/2015   Mood disorder ([place]) 07/13/2015   ALLERGIC RHINITIS 06/06/2007   GERD 06/06/2007   IRRITABLE BOWEL SYNDROME 06/06/2007    Past Medical History:  Diagnosis Date   GERD (gastroesophageal reflux disease)    Hyperlipidemia    Inflammatory polyarthritis ([place])     Family History  Problem Relation Age of Onset   Mental illness Mother    Drug abuse Mother    Diabetes Father    Alcohol abuse Father    Stroke Father    Alcohol abuse Maternal Uncle    Past Surgical History:  Procedure Laterality Date   TOE SURGERY Left    VASECTOMY     Social History   Social History Narrative   Not on file   Immunization History  Administered Date(s) Administered   Moderna Sars-Covid-2 Vaccination 11/04/2019, 12/03/2019     Objective: Vital Signs: BP 112/79 (BP Location: Left Arm, Patient Position: Sitting, Cuff Size: Large)   Pulse 87   Resp 13   Ht 6' 1" (1.854 m)   Wt (!) 303 lb (137.4 kg)   BMI 39.98 kg/m    Physical Exam Constitutional:      Appearance: He is obese.  Skin:    General: Skin is warm and dry.     Findings: No rash.  Neurological:     Mental  Status: He is alert.  Psychiatric:        Mood and Affect: Mood normal.     Musculoskeletal Exam:  Right knee effusion slightly limited flexion and extension range of motion, no tenderness to palpation, left knee is normal Left ankle swelling and tenderness prominent soft tissue edema on medial and lateral side around the level of malleolus and subtalar joint, some tenderness at Achilles tendon insertion, right ankle normal  Investigation: No additional findings.  Imaging: No results found.  Recent Labs: Lab Results  Component Value Date   WBC 7.8 07/18/2021   HGB 15.7 07/18/2021   PLT 248 07/18/2021   NA 138 06/28/2017   K 3.8 06/28/2017   CL 103 06/28/2017   CO2 29 06/28/2017   GLUCOSE 93 06/28/2017   BUN 10 06/28/2017   CREATININE 1.12 06/28/2017   BILITOT 0.7 07/11/2015  ALKPHOS 64 07/11/2015   AST 26 07/11/2015   ALT 32 07/11/2015   PROT 6.9 07/11/2015   ALBUMIN 4.0 07/11/2015   CALCIUM 9.1 06/28/2017   GFRAA >60 06/24/2017    Speciality Comments: No specialty comments available.  Procedures:  No procedures performed Allergies: Bee venom   Assessment / Plan:     Visit Diagnoses: Inflammatory polyarthritis ([place])  Hyperuricemia - Plan: predniSONE (DELTASONE) 10 MG tablet, sulfaSALAzine (AZULFIDINE) 500 MG tablet, colchicine 0.6 MG tablet  So far not a great amount of improvement treating for RA or gout would also consider possible overlap of problems or may be synovial fluid finding was false negative.  Recommend continuing the sulfasalazine 1000 mg twice daily.  He should also resume the allopurinol 300 mg daily and colchicine for gouty arthritis.  We will send another prednisone taper with the current inflammation.  High risk medication use - sulfasalazine 1036m twice daily.   Is currently taking the sulfasalazine without any appreciable side effects or GI intolerance.  Reviewed again monitoring any signs of severe sensitivity reaction of skin  rashes.  Effusion, right knee Pain in left ankle and joints of left foot  Recommending against repeat aspiration so soon after last treatment also the knee range of movement and pain is not too bad.  No large organized fluid collection in the left foot and ankle more distributed subcutaneous edema.    Orders: No orders of the defined types were placed in this encounter.  Meds ordered this encounter  Medications   allopurinol (ZYLOPRIM) 300 MG tablet    Sig: Take 1 tablet (300 mg total) by mouth daily.    Dispense:  30 tablet    Refill:  2   predniSONE (DELTASONE) 10 MG tablet    Sig: Take by mouth once daily: 6, 5, 4, 3, 2 , then 1 tablet daily    Dispense:  21 tablet    Refill:  0   sulfaSALAzine (AZULFIDINE) 500 MG tablet    Sig: Take 2 tablets (1,000 mg total) by mouth 2 (two) times daily. Take 1 tablet AM 1 tablet PM for 1 week, then 2 tablets AM 1 tablet PM for 1 week, then 2 tablets twice daily    Dispense:  120 tablet    Refill:  2   colchicine 0.6 MG tablet    Sig: Take 1 tablet (0.6 mg total) by mouth 2 (two) times daily as needed.    Dispense:  60 tablet    Refill:  2     Follow-Up Instructions: Return in about 4 weeks (around 04/11/2022) for Gout/?RA SSZ/colchicine/allopurinol/GC f/u 1 mo.   CCollier Salina MD  Note - This record has been created using [employer]  Chart creation errors have been sought, but may not always  have been located. Such creation errors do not reflect on  the standard of medical care.

## 2022-03-14 ENCOUNTER — Encounter: Payer: Self-pay | Admitting: Internal Medicine

## 2022-03-14 ENCOUNTER — Ambulatory Visit: Payer: BC Managed Care – PPO | Admitting: Internal Medicine

## 2022-03-14 VITALS — BP 112/79 | HR 87 | Resp 13 | Ht 73.0 in | Wt 303.0 lb

## 2022-03-14 DIAGNOSIS — M25572 Pain in left ankle and joints of left foot: Secondary | ICD-10-CM | POA: Diagnosis not present

## 2022-03-14 DIAGNOSIS — M79671 Pain in right foot: Secondary | ICD-10-CM

## 2022-03-14 DIAGNOSIS — Z79899 Other long term (current) drug therapy: Secondary | ICD-10-CM | POA: Diagnosis not present

## 2022-03-14 DIAGNOSIS — E790 Hyperuricemia without signs of inflammatory arthritis and tophaceous disease: Secondary | ICD-10-CM

## 2022-03-14 DIAGNOSIS — M25461 Effusion, right knee: Secondary | ICD-10-CM

## 2022-03-14 DIAGNOSIS — M064 Inflammatory polyarthropathy: Secondary | ICD-10-CM | POA: Diagnosis not present

## 2022-03-14 MED ORDER — ALLOPURINOL 300 MG PO TABS: 300.0000 mg | ORAL_TABLET | Freq: Every day | ORAL | 2 refills | Status: DC

## 2022-03-14 MED ORDER — SULFASALAZINE 500 MG PO TABS: 1000.0000 mg | ORAL_TABLET | Freq: Two times a day (BID) | ORAL | 2 refills | Status: DC

## 2022-03-14 MED ORDER — PREDNISONE 10 MG PO TABS: ORAL_TABLET | ORAL | 0 refills | Status: DC

## 2022-03-14 MED ORDER — COLCHICINE 0.6 MG PO TABS: 0.6000 mg | ORAL_TABLET | Freq: Two times a day (BID) | ORAL | 2 refills | Status: DC | PRN

## 2022-03-29 NOTE — Progress Notes (Unsigned)
Office Visit Note  Patient: Thomas Schaefer             Date of Birth: [birth year]           MRN: [id number]             PCP: Carlean Jews, NP Referring: Carlean Jews, NP Visit Date: 03/30/2022   Subjective:  No chief complaint on file.   History of Present Illness: Thomas Schaefer is a 41 y.o. male here for follow up for joint inflammation.   Previous HPI 03/14/2022  Thomas Schaefer is a 41 y.o. male here for follow up for joint inflammation.  Shortly after completing most recent steroids he has redeveloped increased swelling of the right knee and swelling and pain in the left ankle.  He noticed the most recent steroid treatment improved his swelling but the left ankle pain was not dramatically improved.  So far he has not seen an obvious difference in swelling while taking the sulfasalazine.   Previous HPI 02/27/2022 Thomas Schaefer is a 41 y.o. male here for follow up for joint inflammation. His knee improved greatly after aspiration and with prednisone taper but since 4 days ago left ankle pain and inflammation started. Pain is worst first thing in the morning and when standing up gets partially better when up and walking around. He is not taking anything for this currently    Previous HPI 02/07/22 Thomas Schaefer is a 41 y.o. male here for follow up for joint inflammation previously seen in October initial plan to treat gouty arthritis not clear if also having RA with a positive RF. He has suffered multiple and persistent joint inflammation treated with colchicine and multiple prednisone courses. He started taking allopurinol last month and within days developed severe pain and stiffness in his right knee and some pain extending up to the right hip posteriorly. Pain is intermittent and stabbing, and he cannot fully extend his knee. Symptoms are not similar to anything that he had in the past. He took 2 12 day prednisone tapers these improved symptoms but came back again once down  to about 20 mg daily dose. He stopped taking colchicine in accordance with directions when prescribed the prednisone.   Previous HPI 08/21/21 Thomas Schaefer is a 41 y.o. male here for foot pain and inflammation with elevated rheumatoid factor and uric acid.  Symptoms started since around July of this year with pain on the right foot worse at the bottom of the foot and the smaller for toes. Initial treatment with colchicine had partial benefit in symptoms.  There is also considerable initial swelling.  He received a short course of oral steroids with more symptom improvement but this persisted. He saw Dr. Al Corpus in September treatment with local steroid infection and oral prednisone taper and blood tests were checked.  Inflammatory markers were normal but rheumatoid factor and uric acid were significantly elevated.  Xray of the foot has shown soft tissue edema without significant osseus abnormalities.  After the injection and additional medication symptoms again improved at this point he is having persistent mild daily symptoms with some pain worsened with pressure and weight on the ball of his foot without much active swelling. He has had some chronic pain and instability of the right knee for a much longer time without any identified problem, xrays have been normal.  He does not drink much alcohol regularly.  He used to drink sodas very heavily but has already decreased this to regular but small  amounts before any onset of the symptoms.   Labs reviewed 06/2021 ANA neg RF 125 Uric acid 11.1 ESR 6 CRP 3.0   No Rheumatology ROS completed.   PMFS History:  Patient Active Problem List   Diagnosis Date Noted   Pain in left ankle and joints of left foot 02/27/2022   Effusion, right knee 02/07/2022   Inflammatory polyarthritis ([place]) 01/15/2022   Cellulitis of lower extremity 01/15/2022   Body mass index (BMI) of 31.0-31.9 in adult 01/15/2022   Acute non-recurrent pansinusitis 12/03/2021    Laryngitis 12/03/2021   Body mass index (BMI) of 40.1-44.9 in adult [facility]) 12/03/2021   Hyperuricemia 08/21/2021   Encounter to establish care 05/14/2021   Right foot pain 05/14/2021   Obesity 07/13/2015   Mood disorder ([place]) 07/13/2015   ALLERGIC RHINITIS 06/06/2007   GERD 06/06/2007   IRRITABLE BOWEL SYNDROME 06/06/2007    Past Medical History:  Diagnosis Date   GERD (gastroesophageal reflux disease)    Hyperlipidemia    Inflammatory polyarthritis ([place])     Family History  Problem Relation Age of Onset   Mental illness Mother    Drug abuse Mother    Diabetes Father    Alcohol abuse Father    Stroke Father    Alcohol abuse Maternal Uncle    Past Surgical History:  Procedure Laterality Date   TOE SURGERY Left    VASECTOMY     Social History   Social History Narrative   Not on file   Immunization History  Administered Date(s) Administered   Moderna Sars-Covid-2 Vaccination 11/04/2019, 12/03/2019     Objective: Vital Signs: There were no vitals taken for this visit.   Physical Exam   Musculoskeletal Exam: ***  CDAI Exam: CDAI Score: -- Patient Global: --; Provider Global: -- Swollen: --; Tender: -- Joint Exam 03/30/2022   No joint exam has been documented for this visit   There is currently no information documented on the homunculus. Go to the Rheumatology activity and complete the homunculus joint exam.  Investigation: No additional findings.  Imaging: No results found.  Recent Labs: Lab Results  Component Value Date   WBC 7.8 07/18/2021   HGB 15.7 07/18/2021   PLT 248 07/18/2021   NA 138 06/28/2017   K 3.8 06/28/2017   CL 103 06/28/2017   CO2 29 06/28/2017   GLUCOSE 93 06/28/2017   BUN 10 06/28/2017   CREATININE 1.12 06/28/2017   BILITOT 0.7 07/11/2015   ALKPHOS 64 07/11/2015   AST 26 07/11/2015   ALT 32 07/11/2015   PROT 6.9 07/11/2015   ALBUMIN 4.0 07/11/2015   CALCIUM 9.1 06/28/2017   GFRAA >60 06/24/2017    Speciality  Comments: No specialty comments available.  Procedures:  No procedures performed Allergies: Bee venom   Assessment / Plan:     Visit Diagnoses: No diagnosis found.  ***  Orders: No orders of the defined types were placed in this encounter.  No orders of the defined types were placed in this encounter.    Follow-Up Instructions: No follow-ups on file.   Earnestine Mealing, CMA  Note - This record has been created using [employer].  Chart creation errors have been sought, but may not always  have been located. Such creation errors do not reflect on  the standard of medical care.

## 2022-03-29 NOTE — Progress Notes (Deleted)
Office Visit Note  Patient: Thomas Schaefer             Date of Birth: 14-Apr-1981           MRN: [id number]             PCP: Carlean Jews, NP Referring: Carlean Jews, NP Visit Date: 04/11/2022   Subjective:  No chief complaint on file.   History of Present Illness: Mylon Mabey is a 41 y.o. male here for follow up for joint inflammation.   Previous HPI 03/14/2022 Bishoy Cupp is a 41 y.o. male here for follow up for joint inflammation.  Shortly after completing most recent steroids he has redeveloped increased swelling of the right knee and swelling and pain in the left ankle.  He noticed the most recent steroid treatment improved his swelling but the left ankle pain was not dramatically improved.  So far he has not seen an obvious difference in swelling while taking the sulfasalazine.   Previous HPI 02/27/2022 Novah Goza is a 41 y.o. male here for follow up for joint inflammation. His knee improved greatly after aspiration and with prednisone taper but since 4 days ago left ankle pain and inflammation started. Pain is worst first thing in the morning and when standing up gets partially better when up and walking around. He is not taking anything for this currently    Previous HPI 02/07/22 Ajit Errico is a 41 y.o. male here for follow up for joint inflammation previously seen in October initial plan to treat gouty arthritis not clear if also having RA with a positive RF. He has suffered multiple and persistent joint inflammation treated with colchicine and multiple prednisone courses. He started taking allopurinol last month and within days developed severe pain and stiffness in his right knee and some pain extending up to the right hip posteriorly. Pain is intermittent and stabbing, and he cannot fully extend his knee. Symptoms are not similar to anything that he had in the past. He took 2 12 day prednisone tapers these improved symptoms but came back again once down  to about 20 mg daily dose. He stopped taking colchicine in accordance with directions when prescribed the prednisone.   Previous HPI 08/21/21 Carlus Stay is a 40 y.o. male here for foot pain and inflammation with elevated rheumatoid factor and uric acid.  Symptoms started since around July of this year with pain on the right foot worse at the bottom of the foot and the smaller for toes. Initial treatment with colchicine had partial benefit in symptoms.  There is also considerable initial swelling.  He received a short course of oral steroids with more symptom improvement but this persisted. He saw Dr. Al Corpus in September treatment with local steroid infection and oral prednisone taper and blood tests were checked.  Inflammatory markers were normal but rheumatoid factor and uric acid were significantly elevated.  Xray of the foot has shown soft tissue edema without significant osseus abnormalities.  After the injection and additional medication symptoms again improved at this point he is having persistent mild daily symptoms with some pain worsened with pressure and weight on the ball of his foot without much active swelling. He has had some chronic pain and instability of the right knee for a much longer time without any identified problem, xrays have been normal.  He does not drink much alcohol regularly.  He used to drink sodas very heavily but has already decreased this to regular but small amounts  before any onset of the symptoms.   Labs reviewed 06/2021 ANA neg RF 125 Uric acid 11.1 ESR 6 CRP 3.0   No Rheumatology ROS completed.   PMFS History:  Patient Active Problem List   Diagnosis Date Noted   Pain in left ankle and joints of left foot 02/27/2022   Effusion, right knee 02/07/2022   Inflammatory polyarthritis ([place]) 01/15/2022   Cellulitis of lower extremity 01/15/2022   Body mass index (BMI) of 31.0-31.9 in adult 01/15/2022   Acute non-recurrent pansinusitis 12/03/2021    Laryngitis 12/03/2021   Body mass index (BMI) of 40.1-44.9 in adult [facility]) 12/03/2021   Hyperuricemia 08/21/2021   Encounter to establish care 05/14/2021   Right foot pain 05/14/2021   Obesity 07/13/2015   Mood disorder ([place]) 07/13/2015   ALLERGIC RHINITIS 06/06/2007   GERD 06/06/2007   IRRITABLE BOWEL SYNDROME 06/06/2007    Past Medical History:  Diagnosis Date   GERD (gastroesophageal reflux disease)    Hyperlipidemia    Inflammatory polyarthritis ([place])     Family History  Problem Relation Age of Onset   Mental illness Mother    Drug abuse Mother    Diabetes Father    Alcohol abuse Father    Stroke Father    Alcohol abuse Maternal Uncle    Past Surgical History:  Procedure Laterality Date   TOE SURGERY Left    VASECTOMY     Social History   Social History Narrative   Not on file   Immunization History  Administered Date(s) Administered   Moderna Sars-Covid-2 Vaccination 11/04/2019, 12/03/2019     Objective: Vital Signs: There were no vitals taken for this visit.   Physical Exam   Musculoskeletal Exam: ***  CDAI Exam: CDAI Score: -- Patient Global: --; Provider Global: -- Swollen: --; Tender: -- Joint Exam 04/11/2022   No joint exam has been documented for this visit   There is currently no information documented on the homunculus. Go to the Rheumatology activity and complete the homunculus joint exam.  Investigation: No additional findings.  Imaging: No results found.  Recent Labs: Lab Results  Component Value Date   WBC 7.8 07/18/2021   HGB 15.7 07/18/2021   PLT 248 07/18/2021   NA 138 06/28/2017   K 3.8 06/28/2017   CL 103 06/28/2017   CO2 29 06/28/2017   GLUCOSE 93 06/28/2017   BUN 10 06/28/2017   CREATININE 1.12 06/28/2017   BILITOT 0.7 07/11/2015   ALKPHOS 64 07/11/2015   AST 26 07/11/2015   ALT 32 07/11/2015   PROT 6.9 07/11/2015   ALBUMIN 4.0 07/11/2015   CALCIUM 9.1 06/28/2017   GFRAA >60 06/24/2017    Speciality  Comments: No specialty comments available.  Procedures:  No procedures performed Allergies: Bee venom   Assessment / Plan:     Visit Diagnoses: No diagnosis found.  ***  Orders: No orders of the defined types were placed in this encounter.  No orders of the defined types were placed in this encounter.    Follow-Up Instructions: No follow-ups on file.   Earnestine Mealing, CMA  Note - This record has been created using Editor, commissioning.  Chart creation errors have been sought, but may not always  have been located. Such creation errors do not reflect on  the standard of medical care.

## 2022-03-30 ENCOUNTER — Encounter: Payer: Self-pay | Admitting: Internal Medicine

## 2022-03-30 ENCOUNTER — Ambulatory Visit: Payer: BC Managed Care – PPO | Admitting: Internal Medicine

## 2022-03-30 VITALS — BP 108/77 | HR 92 | Resp 12 | Ht 73.0 in | Wt 299.0 lb

## 2022-03-30 DIAGNOSIS — M064 Inflammatory polyarthropathy: Secondary | ICD-10-CM | POA: Diagnosis not present

## 2022-03-30 DIAGNOSIS — M109 Gout, unspecified: Secondary | ICD-10-CM

## 2022-03-30 DIAGNOSIS — M25572 Pain in left ankle and joints of left foot: Secondary | ICD-10-CM

## 2022-03-30 DIAGNOSIS — Z79899 Other long term (current) drug therapy: Secondary | ICD-10-CM | POA: Diagnosis not present

## 2022-03-30 DIAGNOSIS — E790 Hyperuricemia without signs of inflammatory arthritis and tophaceous disease: Secondary | ICD-10-CM

## 2022-03-30 DIAGNOSIS — M25461 Effusion, right knee: Secondary | ICD-10-CM

## 2022-03-30 MED ORDER — LIDOCAINE HCL 1 % IJ SOLN
4.0000 mL | INTRAMUSCULAR | Status: AC | PRN
  Administered 2022-03-30: 4 mL

## 2022-03-30 MED ORDER — PREDNISONE 10 MG PO TABS: ORAL_TABLET | ORAL | 0 refills | Status: DC

## 2022-04-02 LAB — COMPLETE METABOLIC PANEL WITH GFR
AG Ratio: 1.5 (calc) (ref 1.0–2.5)
ALT: 18 U/L (ref 9–46)
AST: 20 U/L (ref 10–40)
Albumin: 4.3 g/dL (ref 3.6–5.1)
Alkaline phosphatase (APISO): 60 U/L (ref 36–130)
BUN: 13 mg/dL (ref 7–25)
CO2: 27 mmol/L (ref 20–32)
Calcium: 9.5 mg/dL (ref 8.6–10.3)
Chloride: 105 mmol/L (ref 98–110)
Creat: 1.03 mg/dL (ref 0.60–1.29)
Globulin: 2.8 g/dL (calc) (ref 1.9–3.7)
Glucose, Bld: 74 mg/dL (ref 65–99)
Potassium: 4.4 mmol/L (ref 3.5–5.3)
Sodium: 140 mmol/L (ref 135–146)
Total Bilirubin: 0.6 mg/dL (ref 0.2–1.2)
Total Protein: 7.1 g/dL (ref 6.1–8.1)
eGFR: 94 mL/min/{1.73_m2} (ref >=60)

## 2022-04-02 LAB — HEPATITIS C ANTIBODY
Hepatitis C Ab: NONREACTIVE
SIGNAL TO CUT-OFF: 0.1 (ref <1.00)

## 2022-04-02 LAB — HEPATITIS B CORE ANTIBODY, IGM: Hep B C IgM: NONREACTIVE

## 2022-04-02 LAB — CBC WITH DIFFERENTIAL/PLATELET
Absolute Monocytes: 677 cells/uL (ref 200–950)
Basophils Absolute: 79 cells/uL (ref 0–200)
Basophils Relative: 1.1 %
Eosinophils Absolute: 331 cells/uL (ref 15–500)
Eosinophils Relative: 4.6 %
HCT: 45.3 % (ref 38.5–50.0)
Hemoglobin: 15 g/dL (ref 13.2–17.1)
Lymphs Abs: 1267 cells/uL (ref 850–3900)
MCH: 28.4 pg (ref 27.0–33.0)
MCHC: 33.1 g/dL (ref 32.0–36.0)
MCV: 85.6 fL (ref 80.0–100.0)
MPV: 9.6 fL (ref 7.5–12.5)
Monocytes Relative: 9.4 %
Neutro Abs: 4846 cells/uL (ref 1500–7800)
Neutrophils Relative %: 67.3 %
Platelets: 290 10*3/uL (ref 140–400)
RBC: 5.29 10*6/uL (ref 4.20–5.80)
RDW: 12.9 % (ref 11.0–15.0)
Total Lymphocyte: 17.6 %
WBC: 7.2 10*3/uL (ref 3.8–10.8)

## 2022-04-02 LAB — SEDIMENTATION RATE: Sed Rate: 2 mm/h (ref 0–15)

## 2022-04-02 LAB — URIC ACID: Uric Acid, Serum: 4.4 mg/dL (ref 4.0–8.0)

## 2022-04-02 LAB — HEPATITIS B SURFACE ANTIGEN: Hepatitis B Surface Ag: NONREACTIVE

## 2022-04-02 LAB — C-REACTIVE PROTEIN: CRP: 68.4 mg/L — ABNORMAL HIGH (ref <8.0)

## 2022-04-10 ENCOUNTER — Telehealth: Payer: Self-pay | Admitting: Internal Medicine

## 2022-04-10 DIAGNOSIS — M064 Inflammatory polyarthropathy: Secondary | ICD-10-CM

## 2022-04-10 NOTE — Telephone Encounter (Signed)
Patient called the office stating he had labs over a week ago and has not received a call yet. Patient states he saw his results on MyChart and saw something concerning. Patient requests an urgent call back.

## 2022-04-11 ENCOUNTER — Ambulatory Visit: Payer: BC Managed Care – PPO | Admitting: Internal Medicine

## 2022-04-11 DIAGNOSIS — M25461 Effusion, right knee: Secondary | ICD-10-CM

## 2022-04-11 DIAGNOSIS — M064 Inflammatory polyarthropathy: Secondary | ICD-10-CM

## 2022-04-11 DIAGNOSIS — Z79899 Other long term (current) drug therapy: Secondary | ICD-10-CM

## 2022-04-11 DIAGNOSIS — E790 Hyperuricemia without signs of inflammatory arthritis and tophaceous disease: Secondary | ICD-10-CM

## 2022-04-11 DIAGNOSIS — M25572 Pain in left ankle and joints of left foot: Secondary | ICD-10-CM

## 2022-04-13 MED ORDER — PREDNISONE 10 MG PO TABS: ORAL_TABLET | ORAL | 0 refills | Status: AC

## 2022-04-13 MED ORDER — METHOTREXATE 2.5 MG PO TABS: 15.0000 mg | ORAL_TABLET | ORAL | 1 refills | Status: DC

## 2022-04-13 NOTE — Telephone Encounter (Signed)
Patient called again on 04/13/2022 and left a message requesting a return call to go over his labs ASAP. Patient states he is in pain and his knee is swollen again. Call back number [phone].

## 2022-04-13 NOTE — Telephone Encounter (Signed)
I spoke with Thomas Schaefer his knee swelling returned within about 2 days after last aspiration. This is limiting his range of movement and work. Also having pain in the right leg, particularly when the knee is more swollen. Pain seems to radiate or extend down the left from the level of the hip. No numbness or weakness. I recommend increasing prednisone to 40 mg daily tapering over 2 weeks and switching SSZ to methotrexate starting 15 mg PO weekly. Discussed medication risks and monitoring need and had normal baseline screening labs at previous visit. Plan to check in again or have hi update [place] within 2 weeks if improving or not.

## 2022-04-30 ENCOUNTER — Telehealth: Payer: Self-pay | Admitting: Internal Medicine

## 2022-04-30 DIAGNOSIS — M25461 Effusion, right knee: Secondary | ICD-10-CM

## 2022-04-30 DIAGNOSIS — M109 Gout, unspecified: Secondary | ICD-10-CM

## 2022-04-30 NOTE — Telephone Encounter (Unsigned)
Patient stopped by the clinic hoping to be seen by Dr. Dimple Casey.  Patient was informed that he is scheduled for 06/04/22 at 3:20 which is the first day Dr. Dimple Casey returns from paternity leave.  Patient states his right knee is swollen (no redness or warmth to touch) and it is difficult to walk.  Patient states he was doing great for about 2 weeks and since reducing his Prednisone the swelling has returned. Patient requested a return call.

## 2022-05-02 MED ORDER — PREDNISONE 10 MG PO TABS: 10.0000 mg | ORAL_TABLET | Freq: Two times a day (BID) | ORAL | 0 refills | Status: DC

## 2022-05-02 NOTE — Telephone Encounter (Signed)
Spoke with Thomas Schaefer he has ongoing pain with his knee worsens after reducing prednisone to less than 10 mg BID dosing. Finger and foot pains are better, ankle is partially better. He has scheduled an appointment with orthopedic surgery clinic Friday.

## 2022-05-04 DIAGNOSIS — M25561 Pain in right knee: Secondary | ICD-10-CM | POA: Diagnosis not present

## 2022-05-04 DIAGNOSIS — M76822 Posterior tibial tendinitis, left leg: Secondary | ICD-10-CM | POA: Diagnosis not present

## 2022-05-04 DIAGNOSIS — M25461 Effusion, right knee: Secondary | ICD-10-CM | POA: Diagnosis not present

## 2022-05-15 DIAGNOSIS — M25561 Pain in right knee: Secondary | ICD-10-CM | POA: Diagnosis not present

## 2022-05-17 DIAGNOSIS — M25461 Effusion, right knee: Secondary | ICD-10-CM | POA: Diagnosis not present

## 2022-05-28 NOTE — Progress Notes (Signed)
Office Visit Note  Patient: Thomas Schaefer             Date of Birth: 08-Oct-1981           MRN: [id number]             PCP: Carlean Jews, NP Referring: Carlean Jews, NP Visit Date: 06/04/2022   Subjective:  Follow-up (Pain right knee. )   History of Present Illness: Thomas Schaefer is a 41 y.o. male here for follow up joint inflammation. Since our last visit he saw Dr. Jena Gauss and MRI of right knee obtained showing changes of synovitis and probable ruptured baker's cyst. He has had a steroid injection of the left knee and the left leg is doing well. Hand pain and stiffness are also doing very well mostly just has the knee pain. He started methotrexate since 2 weeks ago tolerating it okay. He decreased prednisone down to 10 mg once daily from twice for a week and is tolerating this okay his knee swelling increased some but not very painful.  Previous HPI 03/30/2022 Thomas Schaefer is a 41 y.o. male here for follow up for joint inflammation. Since our last visit he completed the prednisone his pain was partially improved on this but quickly returned off the medication. Swelling never resolved. He now has increased symptoms involving his hands with decreased grip strength and mobility. He notices increasing pain in left knee and right ankle he thinks is compensating for his worse affected joints.   Previous HPI 03/14/2022 Thomas Schaefer is a 41 y.o. male here for follow up for joint inflammation.  Shortly after completing most recent steroids he has redeveloped increased swelling of the right knee and swelling and pain in the left ankle.  He noticed the most recent steroid treatment improved his swelling but the left ankle pain was not dramatically improved. So far he has not seen an obvious difference in swelling while taking the sulfasalazine.   Previous HPI 02/27/2022 Thomas Schaefer is a 41 y.o. male here for follow up for joint inflammation. His knee improved greatly after  aspiration and with prednisone taper but since 4 days ago left ankle pain and inflammation started. Pain is worst first thing in the morning and when standing up gets partially better when up and walking around. He is not taking anything for this currently    Previous HPI 02/07/22 Thomas Schaefer is a 40 y.o. male here for follow up for joint inflammation previously seen in October initial plan to treat gouty arthritis not clear if also having RA with a positive RF. He has suffered multiple and persistent joint inflammation treated with colchicine and multiple prednisone courses. He started taking allopurinol last month and within days developed severe pain and stiffness in his right knee and some pain extending up to the right hip posteriorly. Pain is intermittent and stabbing, and he cannot fully extend his knee. Symptoms are not similar to anything that he had in the past. He took 2 12 day prednisone tapers these improved symptoms but came back again once down to about 20 mg daily dose. He stopped taking colchicine in accordance with directions when prescribed the prednisone.   Previous HPI 08/21/21 Thomas Schaefer is a 41 y.o. male here for foot pain and inflammation with elevated rheumatoid factor and uric acid.  Symptoms started since around July of this year with pain on the right foot worse at the bottom of the foot and the smaller for toes. Initial treatment with  colchicine had partial benefit in symptoms.  There is also considerable initial swelling.  He received a short course of oral steroids with more symptom improvement but this persisted. He saw Dr. Al Corpus in September treatment with local steroid infection and oral prednisone taper and blood tests were checked.  Inflammatory markers were normal but rheumatoid factor and uric acid were significantly elevated.  Xray of the foot has shown soft tissue edema without significant osseus abnormalities.  After the injection and additional medication  symptoms again improved at this point he is having persistent mild daily symptoms with some pain worsened with pressure and weight on the ball of his foot without much active swelling. He has had some chronic pain and instability of the right knee for a much longer time without any identified problem, xrays have been normal.  He does not drink much alcohol regularly.  He used to drink sodas very heavily but has already decreased this to regular but small amounts before any onset of the symptoms.   Labs reviewed 06/2021 ANA neg RF 125 Uric acid 11.1 ESR 6 CRP 3.0  Review of Systems  Constitutional:  Negative for fatigue.  HENT:  Negative for mouth sores and mouth dryness.   Eyes:  Negative for dryness.  Respiratory:  Negative for shortness of breath.   Cardiovascular:  Negative for chest pain and palpitations.  Gastrointestinal:  Negative for blood in stool, constipation and diarrhea.  Endocrine: Negative for increased urination.  Genitourinary:  Negative for involuntary urination.  Musculoskeletal:  Positive for joint pain, gait problem, joint pain and joint swelling. Negative for myalgias, muscle weakness, morning stiffness, muscle tenderness and myalgias.  Skin:  Positive for rash. Negative for color change, hair loss and sensitivity to sunlight.  Allergic/Immunologic: Negative for susceptible to infections.  Neurological:  Negative for dizziness and headaches.  Hematological:  Negative for swollen glands.  Psychiatric/Behavioral:  Negative for depressed mood and sleep disturbance. The patient is not nervous/anxious.     PMFS History:  Patient Active Problem List   Diagnosis Date Noted   Polyarticular gout 03/30/2022   Pain in left ankle and joints of left foot 02/27/2022   Effusion, right knee 02/07/2022   Inflammatory polyarthritis ([facility]) 01/15/2022   Cellulitis of lower extremity 01/15/2022   Body mass index (BMI) of 31.0-31.9 in adult 01/15/2022   Acute non-recurrent  pansinusitis 12/03/2021   Laryngitis 12/03/2021   Body mass index (BMI) of 40.1-44.9 in adult [facility]) 12/03/2021   Hyperuricemia 08/21/2021   Encounter to establish care 05/14/2021   Right foot pain 05/14/2021   Obesity 07/13/2015   Mood disorder ([facility]) 07/13/2015   ALLERGIC RHINITIS 06/06/2007   GERD 06/06/2007   IRRITABLE BOWEL SYNDROME 06/06/2007    Past Medical History:  Diagnosis Date   GERD (gastroesophageal reflux disease)    Hyperlipidemia    Inflammatory polyarthritis ([facility])     Family History  Problem Relation Age of Onset   Mental illness Mother    Drug abuse Mother    Diabetes Father    Alcohol abuse Father    Stroke Father    Alcohol abuse Maternal Uncle    Past Surgical History:  Procedure Laterality Date   TOE SURGERY Left    VASECTOMY     Social History   Social History Narrative   Not on file   Immunization History  Administered Date(s) Administered   Moderna Sars-Covid-2 Vaccination 11/04/2019, 12/03/2019     Objective: Vital Signs: BP 135/86 (BP Location: Left Arm, Patient Position:  Sitting, Cuff Size: Small)   Pulse 82   Resp 16   Ht 6\' 1"  (1.854 m)   Wt (!) 304 lb (137.9 kg)   BMI 40.11 kg/m    Physical Exam Cardiovascular:     Rate and Rhythm: Normal rate and regular rhythm.  Pulmonary:     Effort: Pulmonary effort is normal.     Breath sounds: Normal breath sounds.  Skin:    General: Skin is warm and dry.     Findings: No rash.  Neurological:     Mental Status: He is alert.  Psychiatric:        Mood and Affect: Mood normal.     Musculoskeletal Exam:  Shoulders full ROM no tenderness or swelling Elbows full ROM no tenderness or swelling Wrists full ROM no tenderness or swelling Fingers full ROM no tenderness or swelling Right knee effusion present and posterior swelling also, slightly decreased flexion ROM tolerated, no significant edema distally or in foot and ankle  Investigation: No additional findings.  Imaging: No  results found.  Recent Labs: Lab Results  Component Value Date   WBC 7.0 06/04/2022   HGB 14.8 06/04/2022   PLT 263 06/04/2022   NA 140 06/04/2022   K 4.3 06/04/2022   CL 105 06/04/2022   CO2 29 06/04/2022   GLUCOSE 91 06/04/2022   BUN 15 06/04/2022   CREATININE 1.04 06/04/2022   BILITOT 0.6 06/04/2022   ALKPHOS 64 07/11/2015   AST 20 06/04/2022   ALT 25 06/04/2022   PROT 6.7 06/04/2022   ALBUMIN 4.0 07/11/2015   CALCIUM 8.8 06/04/2022   GFRAA >60 06/24/2017    Speciality Comments: No specialty comments available.  Procedures:  No procedures performed Allergies: Bee venom   Assessment / Plan:     Visit Diagnoses: Inflammatory polyarthritis (HCC) - Plan: C-reactive protein, methotrexate (RHEUMATREX) 2.5 MG tablet  Inflammation in his hands and ankle all doing well just has continued right knee symptoms.  We will recheck CRP this was previously highly elevated during flareup sed rate has been consistently normal.  Currently on the methotrexate 15 mg p.o. weekly would recommend increase to 25 mg p.o. weekly as he is tolerating this but still requiring medium dose prednisone and persistent effusion.  Continue folic acid 1 mg daily.  Polyarticular gout - Allopurinol 300 mg daily and colchicine for gouty arthritis. Uric acid 4.4 on 03/30/2022. Hyperuricemia  Not sure about gouty arthritis with synovial fluid analysis negative for MSU and lack of response to allopurinol and colchicine and with uric acid down to 4.4. However recommending to keep hyperurecemia under control maybe multiple contributing issues.  Effusion, right knee - Aspiration 03/30/2022 for symptom relief, no repeat local steroid injection due to fairly recent injection about 6 weeks ago. - Plan: predniSONE (DELTASONE) 10 MG tablet  High risk medication use - Methotrexate 6 tablets once weekly. - Plan: CBC with Differential/Platelet, COMPLETE METABOLIC PANEL WITH GFR, CBC with Differential/Platelet, COMPLETE METABOLIC  PANEL WITH GFR  Checking CBC and CMP for methotrexate toxicity monitoring after new medication start.  If labs are okay would recommend he titrate dose up to 25 mg p.o. weekly will need repeat lab visit within a month of this dose change.  Effusion, right knee - Plan: predniSONE (DELTASONE) 10 MG tablet  There are some continued inflammation hopefully will see more improvement with a longer time on the methotrexate in the meantime he did recommend he can follow-up as needed with the orthopedic surgery for aspiration and  local steroid injection.  Orders: Orders Placed This Encounter  Procedures   CBC with Differential/Platelet   COMPLETE METABOLIC PANEL WITH GFR   C-reactive protein   CBC with Differential/Platelet   COMPLETE METABOLIC PANEL WITH GFR   Meds ordered this encounter  Medications   predniSONE (DELTASONE) 10 MG tablet    Sig: Take 1 tablet (10 mg total) by mouth daily with breakfast.    Dispense:  30 tablet    Refill:  2   methotrexate (RHEUMATREX) 2.5 MG tablet    Sig: Take 10 tablets (25 mg total) by mouth once a week. Caution:Chemotherapy. Protect from light.    Dispense:  40 tablet    Refill:  2     Follow-Up Instructions: Return in about 3 months (around 09/04/2022) for ?RA/gout on MTX f/u 3mos needs labs in 1 month.   Fuller Plan, MD  Note - This record has been created using [employer].  Chart creation errors have been sought, but may not always  have been located. Such creation errors do not reflect on  the standard of medical care.

## 2022-06-04 ENCOUNTER — Ambulatory Visit: Payer: BC Managed Care – PPO | Attending: Internal Medicine | Admitting: Internal Medicine

## 2022-06-04 ENCOUNTER — Encounter: Payer: Self-pay | Admitting: Internal Medicine

## 2022-06-04 VITALS — BP 135/86 | HR 82 | Resp 16 | Ht 73.0 in | Wt 304.0 lb

## 2022-06-04 DIAGNOSIS — M064 Inflammatory polyarthropathy: Secondary | ICD-10-CM | POA: Diagnosis not present

## 2022-06-04 DIAGNOSIS — M109 Gout, unspecified: Secondary | ICD-10-CM

## 2022-06-04 DIAGNOSIS — E790 Hyperuricemia without signs of inflammatory arthritis and tophaceous disease: Secondary | ICD-10-CM

## 2022-06-04 DIAGNOSIS — Z79899 Other long term (current) drug therapy: Secondary | ICD-10-CM

## 2022-06-04 DIAGNOSIS — M25461 Effusion, right knee: Secondary | ICD-10-CM | POA: Diagnosis not present

## 2022-06-05 LAB — COMPLETE METABOLIC PANEL WITH GFR
AG Ratio: 1.5 (calc) (ref 1.0–2.5)
ALT: 25 U/L (ref 9–46)
AST: 20 U/L (ref 10–40)
Albumin: 4 g/dL (ref 3.6–5.1)
Alkaline phosphatase (APISO): 63 U/L (ref 36–130)
BUN: 15 mg/dL (ref 7–25)
CO2: 29 mmol/L (ref 20–32)
Calcium: 8.8 mg/dL (ref 8.6–10.3)
Chloride: 105 mmol/L (ref 98–110)
Creat: 1.04 mg/dL (ref 0.60–1.29)
Globulin: 2.7 g/dL (calc) (ref 1.9–3.7)
Glucose, Bld: 91 mg/dL (ref 65–99)
Potassium: 4.3 mmol/L (ref 3.5–5.3)
Sodium: 140 mmol/L (ref 135–146)
Total Bilirubin: 0.6 mg/dL (ref 0.2–1.2)
Total Protein: 6.7 g/dL (ref 6.1–8.1)
eGFR: 93 mL/min/{1.73_m2} (ref >=60)

## 2022-06-05 LAB — CBC WITH DIFFERENTIAL/PLATELET
Absolute Monocytes: 511 cells/uL (ref 200–950)
Basophils Absolute: 63 cells/uL (ref 0–200)
Basophils Relative: 0.9 %
Eosinophils Absolute: 91 cells/uL (ref 15–500)
Eosinophils Relative: 1.3 %
HCT: 43.3 % (ref 38.5–50.0)
Hemoglobin: 14.8 g/dL (ref 13.2–17.1)
Lymphs Abs: 1407 cells/uL (ref 850–3900)
MCH: 29 pg (ref 27.0–33.0)
MCHC: 34.2 g/dL (ref 32.0–36.0)
MCV: 84.9 fL (ref 80.0–100.0)
MPV: 10 fL (ref 7.5–12.5)
Monocytes Relative: 7.3 %
Neutro Abs: 4928 cells/uL (ref 1500–7800)
Neutrophils Relative %: 70.4 %
Platelets: 263 10*3/uL (ref 140–400)
RBC: 5.1 10*6/uL (ref 4.20–5.80)
RDW: 13.8 % (ref 11.0–15.0)
Total Lymphocyte: 20.1 %
WBC: 7 10*3/uL (ref 3.8–10.8)

## 2022-06-05 LAB — C-REACTIVE PROTEIN: CRP: 7.1 mg/L (ref <8.0)

## 2022-06-05 MED ORDER — METHOTREXATE 2.5 MG PO TABS: 25.0000 mg | ORAL_TABLET | ORAL | 2 refills | Status: DC

## 2022-06-05 MED ORDER — PREDNISONE 10 MG PO TABS: 10.0000 mg | ORAL_TABLET | Freq: Every day | ORAL | 2 refills | Status: DC

## 2022-06-05 NOTE — Progress Notes (Signed)
Lab results look fine for continuing the methotrexate. I think he could increase the dose to taking 10 tablets one day weekly if he does he should have repeat labs checked in a month. He can stay at the 10 mg prednisone daily for now.

## 2022-06-25 ENCOUNTER — Other Ambulatory Visit: Payer: Self-pay | Admitting: Internal Medicine

## 2022-06-25 DIAGNOSIS — M064 Inflammatory polyarthropathy: Secondary | ICD-10-CM

## 2022-06-25 NOTE — Telephone Encounter (Signed)
Next Visit: 09/11/2022  Last Visit: 06/04/2022  Last Fill: 06/05/2022  DX: Inflammatory polyarthritis   Current Dose per office note 06/04/2022: 25 mg p.o. weekly  Labs: 06/04/2022 CMP WNL CBC WNL  Okay to refill methotrexate?

## 2022-08-27 ENCOUNTER — Ambulatory Visit: Payer: BC Managed Care – PPO | Attending: Internal Medicine | Admitting: Internal Medicine

## 2022-08-27 ENCOUNTER — Encounter: Payer: Self-pay | Admitting: Internal Medicine

## 2022-08-27 VITALS — BP 123/69 | HR 80 | Resp 15 | Ht 73.0 in | Wt 304.0 lb

## 2022-08-27 DIAGNOSIS — M25461 Effusion, right knee: Secondary | ICD-10-CM

## 2022-08-27 DIAGNOSIS — M064 Inflammatory polyarthropathy: Secondary | ICD-10-CM

## 2022-08-27 MED ORDER — METHOTREXATE SODIUM 2.5 MG PO TABS: 25.0000 mg | ORAL_TABLET | ORAL | 1 refills | Status: DC

## 2022-08-27 MED ORDER — PREDNISONE 10 MG PO TABS: 10.0000 mg | ORAL_TABLET | Freq: Every day | ORAL | 2 refills | Status: DC

## 2022-08-27 MED ORDER — LIDOCAINE HCL 1 % IJ SOLN
3.0000 mL | INTRAMUSCULAR | Status: AC | PRN
  Administered 2022-08-27: 3 mL

## 2022-08-27 NOTE — Progress Notes (Signed)
Office Visit Note  Patient: Thomas Schaefer             Date of Birth: 08/08/1981           MRN: [id number]             PCP: Ronnell Freshwater, NP Referring: Ronnell Freshwater, NP Visit Date: 08/27/2022   Subjective:  Follow-up (Not doing good, not taking MTX due to pharmacy)   History of Present Illness: Thomas Schaefer is a 41 y.o. male here for follow up for RA and gout currently on prednisone 10 mg daily. He stopped taking methotrexate due to running out of prescribed tablets after about 1 month. He saw [facility] for follow up with repeat aspiration and injection in right knee and symptoms stayed better for a month or more before swelling and pain got worse again. No new flare ups anywhere else. He discussed possibility of arthroscopy early next year due to the chronic symptoms.  Previous HPI 06/04/22 Thomas Schaefer is a 41 y.o. male here for follow up joint inflammation. Since our last visit he saw Dr. Doreatha Martin and MRI of right knee obtained showing changes of synovitis and probable ruptured baker's cyst. He has had a steroid injection of the left knee and the left leg is doing well. Hand pain and stiffness are also doing very well mostly just has the knee pain. He started methotrexate since 2 weeks ago tolerating it okay. He decreased prednisone down to 10 mg once daily from twice for a week and is tolerating this okay his knee swelling increased some but not very painful.   Previous HPI 03/30/2022 Thomas Schaefer is a 41 y.o. male here for follow up for joint inflammation. Since our last visit he completed the prednisone his pain was partially improved on this but quickly returned off the medication. Swelling never resolved. He now has increased symptoms involving his hands with decreased grip strength and mobility. He notices increasing pain in left knee and right ankle he thinks is compensating for his worse affected joints.   Previous HPI 03/14/2022 Thomas Schaefer is a 41  y.o. male here for follow up for joint inflammation.  Shortly after completing most recent steroids he has redeveloped increased swelling of the right knee and swelling and pain in the left ankle.  He noticed the most recent steroid treatment improved his swelling but the left ankle pain was not dramatically improved. So far he has not seen an obvious difference in swelling while taking the sulfasalazine.   Previous HPI 02/27/2022 Thomas Schaefer is a 41 y.o. male here for follow up for joint inflammation. His knee improved greatly after aspiration and with prednisone taper but since 4 days ago left ankle pain and inflammation started. Pain is worst first thing in the morning and when standing up gets partially better when up and walking around. He is not taking anything for this currently    Previous HPI 02/07/22 Thomas Schaefer is a 41 y.o. male here for follow up for joint inflammation previously seen in October initial plan to treat gouty arthritis not clear if also having RA with a positive RF. He has suffered multiple and persistent joint inflammation treated with colchicine and multiple prednisone courses. He started taking allopurinol last month and within days developed severe pain and stiffness in his right knee and some pain extending up to the right hip posteriorly. Pain is intermittent and stabbing, and he cannot fully extend his knee. Symptoms are not similar to  anything that he had in the past. He took 2 12 day prednisone tapers these improved symptoms but came back again once down to about 20 mg daily dose. He stopped taking colchicine in accordance with directions when prescribed the prednisone.   Previous HPI 08/21/21 Thomas Schaefer is a 41 y.o. male here for foot pain and inflammation with elevated rheumatoid factor and uric acid.  Symptoms started since around July of this year with pain on the right foot worse at the bottom of the foot and the smaller for toes. Initial treatment with  colchicine had partial benefit in symptoms.  There is also considerable initial swelling.  He received a short course of oral steroids with more symptom improvement but this persisted. He saw Dr. Milinda Pointer in September treatment with local steroid infection and oral prednisone taper and blood tests were checked.  Inflammatory markers were normal but rheumatoid factor and uric acid were significantly elevated.  Xray of the foot has shown soft tissue edema without significant osseus abnormalities.  After the injection and additional medication symptoms again improved at this point he is having persistent mild daily symptoms with some pain worsened with pressure and weight on the ball of his foot without much active swelling. He has had some chronic pain and instability of the right knee for a much longer time without any identified problem, xrays have been normal.  He does not drink much alcohol regularly.  He used to drink sodas very heavily but has already decreased this to regular but small amounts before any onset of the symptoms.   Labs reviewed 06/2021 ANA neg RF 125 Uric acid 11.1 ESR 6 CRP 3.0   Review of Systems  Constitutional:  Negative for weight loss.  Cardiovascular:  Positive for leg swelling.  Gastrointestinal:  Positive for diarrhea.  Musculoskeletal:  Positive for joint pain.  Skin:  Negative for rash.  Neurological:  Negative for weakness.     PMFS History:  Patient Active Problem List   Diagnosis Date Noted   Polyarticular gout 03/30/2022   Pain in left ankle and joints of left foot 02/27/2022   Effusion, right knee 02/07/2022   Inflammatory polyarthritis ([place]) 01/15/2022   Cellulitis of lower extremity 01/15/2022   Body mass index (BMI) of 31.0-31.9 in adult 01/15/2022   Acute non-recurrent pansinusitis 12/03/2021   Laryngitis 12/03/2021   Body mass index (BMI) of 40.1-44.9 in adult [facility]) 12/03/2021   Hyperuricemia 08/21/2021   Encounter to establish care 05/14/2021    Right foot pain 05/14/2021   Obesity 07/13/2015   Mood disorder ([place]) 07/13/2015   ALLERGIC RHINITIS 06/06/2007   GERD 06/06/2007   IRRITABLE BOWEL SYNDROME 06/06/2007    Past Medical History:  Diagnosis Date   GERD (gastroesophageal reflux disease)    Hyperlipidemia    Inflammatory polyarthritis ([place])     Family History  Problem Relation Age of Onset   Mental illness Mother    Drug abuse Mother    Diabetes Father    Alcohol abuse Father    Stroke Father    Alcohol abuse Maternal Uncle    Past Surgical History:  Procedure Laterality Date   TOE SURGERY Left    VASECTOMY     Social History   Social History Narrative   Not on file   Immunization History  Administered Date(s) Administered   Moderna Sars-Covid-2 Vaccination 11/04/2019, 12/03/2019     Objective: Vital Signs: BP 123/69 (BP Location: Left Arm, Patient Position: Sitting, Cuff Size: Normal)   Pulse  80   Resp 15   Ht _0  (1.854 m)   Wt (!) 304 lb (137.9 kg)   BMI 40.11 kg/m    Physical Exam Constitutional:      Appearance: He is obese.  Cardiovascular:     Rate and Rhythm: Normal rate and regular rhythm.  Pulmonary:     Effort: Pulmonary effort is normal.     Breath sounds: Normal breath sounds.  Musculoskeletal:     Right lower leg: No edema.     Left lower leg: No edema.  Skin:    General: Skin is warm and dry.  Neurological:     Mental Status: He is alert.  Psychiatric:        Mood and Affect: Mood normal.      Musculoskeletal Exam:  Shoulders full ROM no tenderness or swelling Elbows full ROM no tenderness or swelling Wrists full ROM no tenderness or swelling Fingers full ROM no tenderness or swelling Right knee effusion present, mildly decreased flexion and extension ROM, left knee normal Ankles full ROM no tenderness or swelling   Investigation: No additional findings.  Imaging: No results found.  Recent Labs: Lab Results  Component Value Date   WBC 7.0 06/04/2022    HGB 14.8 06/04/2022   PLT 263 06/04/2022   NA 140 06/04/2022   K 4.3 06/04/2022   CL 105 06/04/2022   CO2 29 06/04/2022   GLUCOSE 91 06/04/2022   BUN 15 06/04/2022   CREATININE 1.04 06/04/2022   BILITOT 0.6 06/04/2022   ALKPHOS 64 07/11/2015   AST 20 06/04/2022   ALT 25 06/04/2022   PROT 6.7 06/04/2022   ALBUMIN 4.0 07/11/2015   CALCIUM 8.8 06/04/2022   GFRAA >60 06/24/2017    Speciality Comments: No specialty comments available.  Procedures:  Large Joint Inj: R knee on 08/27/2022 10:20 AM Indications: pain and joint swelling Details: 22 G 1.5 in needle, lateral approach Medications: 3 mL lidocaine 1 % Aspirate: 24 mL yellow and blood-tinged; sent for lab analysis Outcome: tolerated well, no immediate complications Procedure, treatment alternatives, risks and benefits explained, specific risks discussed. Consent was given by the patient. Immediately prior to procedure a time out was called to verify the correct patient, procedure, equipment, support staff and site/side marked as required. Patient was prepped and draped in the usual sterile fashion.     Allergies: Bee venom   Assessment / Plan:     Visit Diagnoses: Inflammatory polyarthritis ([place])  Ongoing pain swelling stiffness in the right knee other joints are doing well.  He has required increase in the prednisone dose back up to 20 mg daily control symptoms.  Is not clear whether his period of improved symptoms was related with the most recent knee steroid injection or while he was taking the methotrexate but has been off this for a month.  No episodes of discrete gout attacks despite continuing off treatment.  We will plan to resume the methotrexate 25 mg p.o. weekly folic acid 1 mg daily can continue the prednisone as needed recommending trying to taper to 10 mg or less of tolerable.  Not rechecking labs for toxicity monitoring since he has been off treatment for 1 month.  Effusion, right knee - Plan: predniSONE  (DELTASONE) 10 MG tablet, methotrexate (RHEUMATREX) 2.5 MG tablet, Large Joint Inj: R knee  Aspiration of right knee in clinic today for large recurrent effusion and pain.  He had a steroid injection into the site within the past 2 months so  I will hold off on any repeat steroids.  Aspiration only got part of the volume today.  Orders: Orders Placed This Encounter  Procedures   Large Joint Inj: R knee   Meds ordered this encounter  Medications   predniSONE (DELTASONE) 10 MG tablet    Sig: Take 1 tablet (10 mg total) by mouth daily with breakfast.    Dispense:  30 tablet    Refill:  2   methotrexate (RHEUMATREX) 2.5 MG tablet    Sig: Take 10 tablets (25 mg total) by mouth once a week. Caution:Chemotherapy. Protect from light.    Dispense:  40 tablet    Refill:  1     Follow-Up Instructions: Return in about 2 months (around 10/27/2022) for RA/?gout on MTX/GC f/u 30mo.   CCollier Salina MD  Note - This record has been created using [employer]  Chart creation errors have been sought, but may not always  have been located. Such creation errors do not reflect on  the standard of medical care.

## 2022-08-30 ENCOUNTER — Telehealth: Payer: Self-pay | Admitting: *Deleted

## 2022-08-30 NOTE — Telephone Encounter (Signed)
Patient contacted the office and states he was seen on 08/27/2022. Patient states he restarted the MTX that evening. Patient states on 08/28/2022 he was having soreness, pain and popping in hands, wrists, ankles and knees. Patient states every joint was hurting. Patient states on 08/29/2022 he started having cramping in his hip and right foot causing difficulty walking. Patient states today he has weakness in his arms and legs. Patient states he is unable to lift his ar,s. Patient states he did not have this trouble prior to restarting the MTX. Patient is on Prednisone 10 mg daily. Patient states he took an extra dose last night and then took 20 mg early this morning. Patient states that has improved the symptoms.

## 2022-09-02 NOTE — Progress Notes (Unsigned)
Established patient visit   Patient: Thomas Schaefer   DOB: December 23, 1980   41 y.o. Male  MRN: [id number] Visit Date: 09/03/2022   No chief complaint on file.  Subjective    HPI  Routine follow up -concern for weight management  -will need to have routine, fasting labs  -sees rheumatology for inflammatory polyarthritis    Medications: Outpatient Medications Prior to Visit  Medication Sig   EPIPEN 2-PAK 0.3 MG/0.3ML SOAJ injection  (Patient not taking: Reported on 02/27/2022)   methotrexate (RHEUMATREX) 2.5 MG tablet Take 10 tablets (25 mg total) by mouth once a week. Caution:Chemotherapy. Protect from light.   predniSONE (DELTASONE) 10 MG tablet Take 1 tablet (10 mg total) by mouth daily with breakfast.   No facility-administered medications prior to visit.    Review of Systems  {Labs (Optional):23779}   Objective    There were no vitals filed for this visit. There is no height or weight on file to calculate BMI.  BP Readings from Last 3 Encounters:  08/27/22 123/69  06/04/22 135/86  03/30/22 108/77    Wt Readings from Last 3 Encounters:  08/27/22 (Abnormal) 304 lb (137.9 kg)  06/04/22 (Abnormal) 304 lb (137.9 kg)  03/30/22 299 lb (135.6 kg)    Physical Exam  ***  No results found for any visits on 09/03/22.  Assessment & Plan     Problem List Items Addressed This Visit   None    No follow-ups on file.         Ronnell Freshwater, NP  [facility] at [facility] [phone] (phone) [phone] (fax)  [place]

## 2022-09-03 ENCOUNTER — Encounter: Payer: Self-pay | Admitting: Nurse Practitioner

## 2022-09-03 ENCOUNTER — Ambulatory Visit: Payer: BC Managed Care – PPO | Admitting: Nurse Practitioner

## 2022-09-03 VITALS — BP 119/73 | HR 63 | Ht 73.0 in | Wt 312.1 lb

## 2022-09-03 DIAGNOSIS — R635 Abnormal weight gain: Secondary | ICD-10-CM | POA: Diagnosis not present

## 2022-09-03 DIAGNOSIS — Z6841 Body Mass Index (BMI) 40.0 and over, adult: Secondary | ICD-10-CM | POA: Diagnosis not present

## 2022-09-03 DIAGNOSIS — R5383 Other fatigue: Secondary | ICD-10-CM | POA: Diagnosis not present

## 2022-09-03 DIAGNOSIS — R7301 Impaired fasting glucose: Secondary | ICD-10-CM | POA: Diagnosis not present

## 2022-09-04 LAB — COMPREHENSIVE METABOLIC PANEL
ALT: 24 IU/L (ref 0–44)
AST: 17 IU/L (ref 0–40)
Albumin/Globulin Ratio: 1.7 (ref 1.2–2.2)
Albumin: 3.8 g/dL — ABNORMAL LOW (ref 4.1–5.1)
Alkaline Phosphatase: 79 IU/L (ref 44–121)
BUN/Creatinine Ratio: 11 (ref 9–20)
BUN: 11 mg/dL (ref 6–24)
Bilirubin Total: 0.3 mg/dL (ref 0.0–1.2)
CO2: 25 mmol/L (ref 20–29)
Calcium: 8.9 mg/dL (ref 8.7–10.2)
Chloride: 105 mmol/L (ref 96–106)
Creatinine, Ser: 1.01 mg/dL (ref 0.76–1.27)
Globulin, Total: 2.2 g/dL (ref 1.5–4.5)
Glucose: 72 mg/dL (ref 70–99)
Potassium: 4.4 mmol/L (ref 3.5–5.2)
Sodium: 143 mmol/L (ref 134–144)
Total Protein: 6 g/dL (ref 6.0–8.5)
eGFR: 96 mL/min/{1.73_m2} (ref >59)

## 2022-09-04 LAB — CBC
Hematocrit: 44.3 % (ref 37.5–51.0)
Hemoglobin: 14.7 g/dL (ref 13.0–17.7)
MCH: 29.1 pg (ref 26.6–33.0)
MCHC: 33.2 g/dL (ref 31.5–35.7)
MCV: 88 fL (ref 79–97)
Platelets: 265 10*3/uL (ref 150–450)
RBC: 5.06 x10E6/uL (ref 4.14–5.80)
RDW: 13 % (ref 11.6–15.4)
WBC: 7.8 10*3/uL (ref 3.4–10.8)

## 2022-09-04 LAB — TSH+FREE T4
Free T4: 1.43 ng/dL (ref 0.82–1.77)
TSH: 1.72 u[IU]/mL (ref 0.450–4.500)

## 2022-09-04 LAB — HEMOGLOBIN A1C
Est. average glucose Bld gHb Est-mCnc: 114 mg/dL
Hgb A1c MFr Bld: 5.6 % (ref 4.8–5.6)

## 2022-09-04 NOTE — Telephone Encounter (Signed)
I discussed the symptoms with Thomas Schaefer he had severe increase in stiffness particularly within the first 2 days after resuming the methotrexate.  He did not have any similar problem to this when he started or titrated the medicine originally.  Symptoms have gotten better after taking increased dose of prednisone initially but he is still using the 10 mg twice daily for right now. I am not sure that this is because restarting at the higher methotrexate dose, there were no new medications administered at our clinic visit.  I recommend he try taking the methotrexate at the lower/starting dose 15 mg for his dose later this week and see if this is better tolerated.  If he has similar problems again he can take the higher prednisone dose but would recommend discontinuing methotrexate if that happens.

## 2022-09-11 ENCOUNTER — Ambulatory Visit: Payer: BC Managed Care – PPO | Admitting: Internal Medicine

## 2022-09-13 ENCOUNTER — Telehealth: Payer: Self-pay | Admitting: *Deleted

## 2022-09-13 DIAGNOSIS — M25461 Effusion, right knee: Secondary | ICD-10-CM

## 2022-09-13 MED ORDER — PREDNISONE 10 MG PO TABS: 10.0000 mg | ORAL_TABLET | Freq: Two times a day (BID) | ORAL | 1 refills | Status: DC

## 2022-09-13 NOTE — Telephone Encounter (Signed)
Patient called again, upset that he has not received a phone call or new prescription for the prednisone and he is out of medication. I advised the patient that the message was sent to Dr. Dimple Casey as a high priority and he would be getting back in touch with him. The patient then told me he is out of medication and "this is why I called this morning".  I advised patient Dr. Dimple Casey would be reaching out to him.

## 2022-09-13 NOTE — Telephone Encounter (Signed)
Patient contacted the office stating he was under the understanding that he was to be taking Prednisone 2 tablets daily. Patient states he thought a new prescription was sent to the pharmacy. Please advise

## 2022-09-13 NOTE — Telephone Encounter (Signed)
I called Thomas Schaefer at [phone] left voicemail that I received his message and sent new Rx for prednisone 10 mg for now to avoid lapse in treatment. Also that I plan to try and reach him by phone tomorrow to discuss symptoms and methotrexate dose adjustment.

## 2022-09-13 NOTE — Addendum Note (Signed)
Addended by: Fuller Plan on: 09/13/2022 05:03 PM   Modules accepted: Orders

## 2022-09-14 MED ORDER — METHOTREXATE SODIUM 2.5 MG PO TABS: 20.0000 mg | ORAL_TABLET | ORAL | 1 refills | Status: DC

## 2022-09-14 NOTE — Telephone Encounter (Signed)
He is doing okay after increasing methotrexate back to 8 tablets no repeat symptoms like he had 2 weeks ago. I recommend just staying at this dose for now. He can try to taper the prednisone down to 10 mg just once daily if tolerable since this is probably causing a lot of his unintentional weight gain.

## 2022-09-14 NOTE — Addendum Note (Signed)
Addended by: Fuller Plan on: 09/14/2022 04:14 PM   Modules accepted: Orders

## 2022-09-25 ENCOUNTER — Ambulatory Visit: Payer: BC Managed Care – PPO | Admitting: Internal Medicine

## 2022-10-01 ENCOUNTER — Ambulatory Visit: Payer: BC Managed Care – PPO | Admitting: Nurse Practitioner

## 2022-10-01 ENCOUNTER — Encounter: Payer: Self-pay | Admitting: Nurse Practitioner

## 2022-10-01 VITALS — BP 143/88 | HR 64 | Resp 18 | Ht 73.0 in | Wt 307.0 lb

## 2022-10-01 DIAGNOSIS — M064 Inflammatory polyarthropathy: Secondary | ICD-10-CM | POA: Diagnosis not present

## 2022-10-01 DIAGNOSIS — E785 Hyperlipidemia, unspecified: Secondary | ICD-10-CM | POA: Diagnosis not present

## 2022-10-01 DIAGNOSIS — Z6841 Body Mass Index (BMI) 40.0 and over, adult: Secondary | ICD-10-CM | POA: Diagnosis not present

## 2022-10-01 MED ORDER — PHENTERMINE HCL 37.5 MG PO TABS: 37.5000 mg | ORAL_TABLET | Freq: Every day | ORAL | 1 refills | Status: DC

## 2022-10-01 NOTE — Progress Notes (Signed)
Established patient visit   Patient: Thomas Schaefer   DOB: 09/25/1981   41 y.o. Male  MRN: [id number] Visit Date: 10/01/2022   Chief Complaint  Patient presents with   Weight Check   Subjective    HPI  Follow up -weight management  -labs checked prior to today.  --normal thyroid.  --blood sugar normal. Normal HgbA1c  -instructed on reduced calorie diet.  -exercise ability limited due to inflammatory arthritis  -he has had a five pound weight loss in past one month.   Medications: Outpatient Medications Prior to Visit  Medication Sig   EPIPEN 2-PAK 0.3 MG/0.3ML SOAJ injection    methotrexate (RHEUMATREX) 2.5 MG tablet Take 8 tablets (20 mg total) by mouth once a week. Caution:Chemotherapy. Protect from light.   predniSONE (DELTASONE) 10 MG tablet Take 1 tablet (10 mg total) by mouth 2 (two) times daily.   No facility-administered medications prior to visit.    Review of Systems  Constitutional:  Negative for activity change, chills, fatigue and fever.       Five pound weight loss since last visit   HENT:  Negative for congestion, postnasal drip, rhinorrhea, sinus pressure, sinus pain, sneezing and sore throat.   Eyes: Negative.   Respiratory:  Negative for cough, shortness of breath and wheezing.   Cardiovascular:  Negative for chest pain and palpitations.  Gastrointestinal:  Negative for constipation, diarrhea, nausea and vomiting.  Endocrine: Negative for cold intolerance, heat intolerance, polydipsia and polyuria.  Genitourinary:  Negative for dysuria, frequency and urgency.  Musculoskeletal:  Negative for back pain and myalgias.  Skin:  Negative for rash.  Allergic/Immunologic: Negative for environmental allergies.  Neurological:  Negative for dizziness, weakness and headaches.  Psychiatric/Behavioral:  The patient is not nervous/anxious.     Last CBC Lab Results  Component Value Date   WBC 7.8 09/03/2022   HGB 14.7 09/03/2022   HCT 44.3 09/03/2022   MCV 88  09/03/2022   MCH 29.1 09/03/2022   RDW 13.0 09/03/2022   PLT 265 32/95/1884   Last metabolic panel Lab Results  Component Value Date   GLUCOSE 72 09/03/2022   NA 143 09/03/2022   K 4.4 09/03/2022   CL 105 09/03/2022   CO2 25 09/03/2022   BUN 11 09/03/2022   CREATININE 1.01 09/03/2022   EGFR 96 09/03/2022   CALCIUM 8.9 09/03/2022   PROT 6.0 09/03/2022   ALBUMIN 3.8 (L) 09/03/2022   LABGLOB 2.2 09/03/2022   AGRATIO 1.7 09/03/2022   BILITOT 0.3 09/03/2022   ALKPHOS 79 09/03/2022   AST 17 09/03/2022   ALT 24 09/03/2022   ANIONGAP 7 06/24/2017   Last lipids Lab Results  Component Value Date   CHOL 184 07/11/2015   HDL 31.40 (L) 07/11/2015   LDLCALC 117 (H) 07/11/2015   TRIG 179.0 (H) 07/11/2015   CHOLHDL 6 07/11/2015   Last hemoglobin A1c Lab Results  Component Value Date   HGBA1C 5.6 09/03/2022   Last thyroid functions Lab Results  Component Value Date   TSH 1.720 09/03/2022        Objective     Today's Vitals   10/01/22 1452  BP: (Abnormal) 143/88  Pulse: 64  Resp: 18  SpO2: 98%  Weight: (Abnormal) 307 lb (139.3 kg)  Height: _0  (1.854 m)   Body mass index is 40.5 kg/m.  BP Readings from Last 3 Encounters:  10/01/22 (Abnormal) 143/88  09/03/22 119/73  08/27/22 123/69    Wt Readings from Last 3 Encounters:  10/01/22 (  Abnormal) 307 lb (139.3 kg)  09/03/22 (Abnormal) 312 lb 1.9 oz (141.6 kg)  08/27/22 (Abnormal) 304 lb (137.9 kg)    Physical Exam Vitals and nursing note reviewed.  Constitutional:      Appearance: Normal appearance. He is well-developed. He is obese.  HENT:     Head: Normocephalic and atraumatic.     Nose: Nose normal.     Mouth/Throat:     Mouth: Mucous membranes are moist.     Pharynx: Oropharynx is clear.  Eyes:     Extraocular Movements: Extraocular movements intact.     Conjunctiva/sclera: Conjunctivae normal.     Pupils: Pupils are equal, round, and reactive to light.  Cardiovascular:     Rate and Rhythm:  Normal rate and regular rhythm.     Pulses: Normal pulses.     Heart sounds: Normal heart sounds.  Pulmonary:     Effort: Pulmonary effort is normal.     Breath sounds: Normal breath sounds.  Abdominal:     Palpations: Abdomen is soft.  Musculoskeletal:        General: Normal range of motion.     Cervical back: Normal range of motion and neck supple.  Lymphadenopathy:     Cervical: No cervical adenopathy.  Skin:    General: Skin is warm and dry.     Capillary Refill: Capillary refill takes less than 2 seconds.  Neurological:     General: No focal deficit present.     Mental Status: He is alert and oriented to person, place, and time.  Psychiatric:        Mood and Affect: Mood normal.        Behavior: Behavior normal.        Thought Content: Thought content normal.        Judgment: Judgment normal.       Assessment & Plan    1. Inflammatory polyarthritis ([place]) Patient should continue regular visits with rheumatology   2. Dyslipidemia, goal LDL below 100 Reviewed labs with mildly elevated LDL. Recommend patient limit intake of fried and fatty foods. He should increase intake of lean proteins and green leafy vegetables. Adding exercise into daily routine will also be beneficial.    3. BMI 40.0-44.9, adult ([place]) Start phentermine daily. Continue with low calorie, high-protein diet. Incorporate exercise into daily activities.  - phentermine (ADIPEX-P) 37.5 MG tablet; Take 1 tablet (37.5 mg total) by mouth daily before breakfast.  Dispense: 30 tablet; Refill: 1   Problem List Items Addressed This Visit       Musculoskeletal and Integument   Inflammatory polyarthritis ([place]) - Primary     Other   BMI 40.0-44.9, adult ([facility])   Relevant Medications   phentermine (ADIPEX-P) 37.5 MG tablet   Dyslipidemia, goal LDL below 100     Return in about 2 months (around 12/02/2022) for routine - weight management.         Ronnell Freshwater, NP  [facility] Primary [facility] at [facility] [phone] (phone) [phone] (fax)  [place]

## 2022-10-20 DIAGNOSIS — E785 Hyperlipidemia, unspecified: Secondary | ICD-10-CM | POA: Insufficient documentation

## 2022-10-31 ENCOUNTER — Ambulatory Visit: Payer: BC Managed Care – PPO | Admitting: Internal Medicine

## 2022-11-29 ENCOUNTER — Telehealth: Payer: Self-pay | Admitting: *Deleted

## 2022-11-29 DIAGNOSIS — M25461 Effusion, right knee: Secondary | ICD-10-CM

## 2022-11-29 DIAGNOSIS — M25561 Pain in right knee: Secondary | ICD-10-CM | POA: Diagnosis not present

## 2022-11-29 NOTE — Telephone Encounter (Signed)
Patient contacted the office and states he has discontinued the MTX. Patient states he has taken about 4 doses. Patient states with each dose he experienced his knuckles turning red and hurting. He states he was unable to make a fist until 3-4 days after taking MTX. Patient would like to know if he can have another 1-2 months of prednisone.

## 2022-12-03 ENCOUNTER — Encounter: Payer: Self-pay | Admitting: Nurse Practitioner

## 2022-12-03 ENCOUNTER — Other Ambulatory Visit: Payer: Self-pay

## 2022-12-03 ENCOUNTER — Ambulatory Visit: Payer: BC Managed Care – PPO | Admitting: Nurse Practitioner

## 2022-12-03 VITALS — BP 133/82 | HR 82 | Ht 73.0 in | Wt 313.1 lb

## 2022-12-03 DIAGNOSIS — M064 Inflammatory polyarthropathy: Secondary | ICD-10-CM | POA: Diagnosis not present

## 2022-12-03 DIAGNOSIS — Z6841 Body Mass Index (BMI) 40.0 and over, adult: Secondary | ICD-10-CM

## 2022-12-03 DIAGNOSIS — M25461 Effusion, right knee: Secondary | ICD-10-CM

## 2022-12-03 MED ORDER — PREDNISONE 10 MG PO TABS: 10.0000 mg | ORAL_TABLET | Freq: Every day | ORAL | 0 refills | Status: DC

## 2022-12-03 NOTE — Addendum Note (Signed)
Addended by: Earnestine Mealing on: 12/03/2022 12:04 PM   Modules accepted: Orders

## 2022-12-03 NOTE — Telephone Encounter (Signed)
Advised patient that per Dr. Benjamine Mola,  I agree with discontinuing methotrexate if he is continue to notice worsening symptoms when he takes the medication.  Okay to refill prednisone dose once daily for up to 2 months but we need to schedule a clinic follow-up during that time to revisit treatment options.  Patient verbalized understanding and is now scheduled for 01/07/2023.  Patient is requesting the prednisone refill is sent to [facility]. Please review and send pended rx. Thanks!

## 2022-12-03 NOTE — Telephone Encounter (Signed)
Based on previous telephone note:  I advised patient that per Dr. Benjamine Mola,  I agree with discontinuing methotrexate if he is continue to notice worsening symptoms when he takes the medication.  Okay to refill prednisone dose once daily for up to 2 months but we need to schedule a clinic follow-up during that time to revisit treatment options.  Patient verbalized understanding and is now scheduled for 01/07/2023.   Please review and sign pended prednisone refill. Thanks!

## 2022-12-03 NOTE — Telephone Encounter (Signed)
I agree with discontinuing methotrexate if he is continue to notice worsening symptoms when he takes the medication.  Okay to refill prednisone dose once daily for up to 2 months but we need to schedule a clinic follow-up during that time to revisit treatment options.

## 2022-12-03 NOTE — Progress Notes (Signed)
Established patient visit   Patient: Thomas Schaefer   DOB: 05/23/1981   42 y.o. Male  MRN: [id number] Visit Date: 12/03/2022   Chief Complaint  Patient presents with   Follow-up   Weight Check   Subjective    HPI  Follow up  -weight management -trial phentermine 10/01/2022 -starting weight 10/02/2023 - 307 -today's weight  - 12/03/2022 - 313 -weight change since last visit - 6 pound weight gain -good blood pressure control -no negative side effects associated with taking phentermine.    Medications: Outpatient Medications Prior to Visit  Medication Sig   EPIPEN 2-PAK 0.3 MG/0.3ML SOAJ injection    phentermine (ADIPEX-P) 37.5 MG tablet Take 1 tablet (37.5 mg total) by mouth daily before breakfast.   predniSONE (DELTASONE) 10 MG tablet Take 1 tablet (10 mg total) by mouth daily with breakfast.   [DISCONTINUED] methotrexate (RHEUMATREX) 2.5 MG tablet Take 8 tablets (20 mg total) by mouth once a week. Caution:Chemotherapy. Protect from light.   No facility-administered medications prior to visit.    Review of Systems  Constitutional:  Negative for activity change, chills, fatigue and fever.       6 pound weight gain since last visit    HENT:  Negative for congestion, postnasal drip, rhinorrhea, sinus pressure, sinus pain, sneezing and sore throat.   Eyes: Negative.   Respiratory:  Negative for cough, shortness of breath and wheezing.   Cardiovascular:  Negative for chest pain and palpitations.  Gastrointestinal:  Negative for constipation, diarrhea, nausea and vomiting.  Endocrine: Negative for cold intolerance, heat intolerance, polydipsia and polyuria.  Genitourinary:  Negative for dysuria, frequency and urgency.  Musculoskeletal:  Negative for back pain and myalgias.  Skin:  Negative for rash.  Allergic/Immunologic: Negative for environmental allergies.  Neurological:  Negative for dizziness, weakness and headaches.  Psychiatric/Behavioral:  The patient is not  nervous/anxious.        Objective     Today's Vitals   12/03/22 1444  BP: 133/82  Pulse: 82  SpO2: 97%  Weight: (Abnormal) 313 lb 1.9 oz (142 kg)  Height: '6\' 1"'$  (1.854 m)   Body mass index is 41.31 kg/m.  BP Readings from Last 3 Encounters:  12/03/22 133/82  10/01/22 (Abnormal) 143/88  09/03/22 119/73    Wt Readings from Last 3 Encounters:  12/03/22 (Abnormal) 313 lb 1.9 oz (142 kg)  10/01/22 (Abnormal) 307 lb (139.3 kg)  09/03/22 (Abnormal) 312 lb 1.9 oz (141.6 kg)    Physical Exam Vitals and nursing note reviewed.  Constitutional:      Appearance: Normal appearance. He is well-developed. He is obese.  HENT:     Head: Normocephalic and atraumatic.     Nose: Nose normal.     Mouth/Throat:     Mouth: Mucous membranes are moist.     Pharynx: Oropharynx is clear.  Eyes:     Extraocular Movements: Extraocular movements intact.     Conjunctiva/sclera: Conjunctivae normal.     Pupils: Pupils are equal, round, and reactive to light.  Cardiovascular:     Rate and Rhythm: Normal rate and regular rhythm.     Pulses: Normal pulses.     Heart sounds: Normal heart sounds.  Pulmonary:     Effort: Pulmonary effort is normal.     Breath sounds: Normal breath sounds.  Abdominal:     Palpations: Abdomen is soft.  Musculoskeletal:        General: Normal range of motion.     Cervical back: Normal range of  motion and neck supple.  Lymphadenopathy:     Cervical: No cervical adenopathy.  Skin:    General: Skin is warm and dry.     Capillary Refill: Capillary refill takes less than 2 seconds.  Neurological:     General: No focal deficit present.     Mental Status: He is alert and oriented to person, place, and time.  Psychiatric:        Mood and Affect: Mood normal.        Behavior: Behavior normal.        Thought Content: Thought content normal.        Judgment: Judgment normal.      Assessment & Plan    1. Inflammatory polyarthritis ([place]) Patient to continue regular  visits with rheumatologist as scheduled   2. BMI 40.0-44.9, adult (HCC) Trial phentermine unsuccessful. Encourage patient to limit calorie intake to 2000 cal/day or less.  He should consume a low cholesterol, low-fat diet.  Patient incorporate exercise into his daily routine.  He wilto l contact insurance to see if GLP 1 treatment is covered. Will do trial of covered medication along with diet and exercise. Patient to contact the office  when he finds out about covered treatment options.    Problem List Items Addressed This Visit       Musculoskeletal and Integument   Inflammatory polyarthritis ([place]) - Primary     Other   BMI 40.0-44.9, adult ([place])     Return in about 2 months (around 02/01/2023) for routine - weight management.         Ronnell Freshwater, NP  [facility] [phone] (phone) [phone] (fax)  [place]

## 2022-12-06 DIAGNOSIS — M25561 Pain in right knee: Secondary | ICD-10-CM | POA: Diagnosis not present

## 2022-12-31 ENCOUNTER — Ambulatory Visit: Payer: BC Managed Care – PPO | Attending: Internal Medicine | Admitting: Internal Medicine

## 2022-12-31 ENCOUNTER — Encounter: Payer: Self-pay | Admitting: Internal Medicine

## 2022-12-31 VITALS — BP 124/83 | HR 65 | Resp 18 | Ht 73.0 in | Wt 312.0 lb

## 2022-12-31 DIAGNOSIS — M064 Inflammatory polyarthropathy: Secondary | ICD-10-CM | POA: Diagnosis not present

## 2022-12-31 DIAGNOSIS — Z79899 Other long term (current) drug therapy: Secondary | ICD-10-CM | POA: Insufficient documentation

## 2022-12-31 DIAGNOSIS — M25461 Effusion, right knee: Secondary | ICD-10-CM | POA: Diagnosis not present

## 2022-12-31 DIAGNOSIS — M109 Gout, unspecified: Secondary | ICD-10-CM

## 2022-12-31 MED ORDER — PREDNISONE 10 MG PO TABS: 10.0000 mg | ORAL_TABLET | Freq: Every day | ORAL | 2 refills | Status: DC

## 2022-12-31 MED ORDER — LEFLUNOMIDE 20 MG PO TABS: 20.0000 mg | ORAL_TABLET | Freq: Every day | ORAL | 2 refills | Status: DC

## 2022-12-31 NOTE — Progress Notes (Signed)
Office Visit Note  Patient: Thomas Schaefer             Date of Birth: 08/21/1981           MRN: [id number]             PCP: Ronnell Freshwater, NP Referring: Ronnell Freshwater, NP Visit Date: 12/31/2022   Subjective:  Follow-up   History of Present Illness: Thomas Schaefer is a 42 y.o. male here for follow up for RA and gout currently on prednisone 10 mg daily.  Right now he is in a symptom exacerbation doing much worse had a flareup after an exercise training session last week.  He is having pain in his right knee which has been the main ongoing issue also having more trouble with his shoulders right now with pain worse posteriorly especially feels increased pain when lying on his side in bed.  He has had some continued weight gain with the long-term prednisone use starting phentermine for weight loss has not seen any progress so far.  He is scheduled for right knee surgery coming up in 1 week.  Previous HPI 08/27/22 Thomas Schaefer is a 42 y.o. male here for follow up for RA and gout currently on prednisone 10 mg daily. He stopped taking methotrexate due to running out of prescribed tablets after about 1 month. He saw [facility] for follow up with repeat aspiration and injection in right knee and symptoms stayed better for a month or more before swelling and pain got worse again. No new flare ups anywhere else. He discussed possibility of arthroscopy early next year due to the chronic symptoms.   Previous HPI 06/04/22 Thomas Schaefer is a 42 y.o. male here for follow up joint inflammation. Since our last visit he saw Dr. Doreatha Martin and MRI of right knee obtained showing changes of synovitis and probable ruptured baker's cyst. He has had a steroid injection of the left knee and the left leg is doing well. Hand pain and stiffness are also doing very well mostly just has the knee pain. He started methotrexate since 2 weeks ago tolerating it okay. He decreased prednisone down to 10 mg once  daily from twice for a week and is tolerating this okay his knee swelling increased some but not very painful.   Previous HPI 03/30/2022 Thomas Schaefer is a 42 y.o. male here for follow up for joint inflammation. Since our last visit he completed the prednisone his pain was partially improved on this but quickly returned off the medication. Swelling never resolved. He now has increased symptoms involving his hands with decreased grip strength and mobility. He notices increasing pain in left knee and right ankle he thinks is compensating for his worse affected joints.   Previous HPI 03/14/2022 Thomas Schaefer is a 42 y.o. male here for follow up for joint inflammation.  Shortly after completing most recent steroids he has redeveloped increased swelling of the right knee and swelling and pain in the left ankle.  He noticed the most recent steroid treatment improved his swelling but the left ankle pain was not dramatically improved. So far he has not seen an obvious difference in swelling while taking the sulfasalazine.   Previous HPI 02/27/2022 Thomas Schaefer is a 42 y.o. male here for follow up for joint inflammation. His knee improved greatly after aspiration and with prednisone taper but since 4 days ago left ankle pain and inflammation started. Pain is worst first thing in the morning and when standing  up gets partially better when up and walking around. He is not taking anything for this currently    Previous HPI 02/07/22 Thomas Schaefer is a 42 y.o. male here for follow up for joint inflammation previously seen in October initial plan to treat gouty arthritis not clear if also having RA with a positive RF. He has suffered multiple and persistent joint inflammation treated with colchicine and multiple prednisone courses. He started taking allopurinol last month and within days developed severe pain and stiffness in his right knee and some pain extending up to the right hip posteriorly. Pain is  intermittent and stabbing, and he cannot fully extend his knee. Symptoms are not similar to anything that he had in the past. He took 2 12 day prednisone tapers these improved symptoms but came back again once down to about 20 mg daily dose. He stopped taking colchicine in accordance with directions when prescribed the prednisone.   Previous HPI 08/21/21 Thomas Schaefer is a 42 y.o. male here for foot pain and inflammation with elevated rheumatoid factor and uric acid.  Symptoms started since around July of this year with pain on the right foot worse at the bottom of the foot and the smaller for toes. Initial treatment with colchicine had partial benefit in symptoms.  There is also considerable initial swelling.  He received a short course of oral steroids with more symptom improvement but this persisted. He saw Dr. Milinda Pointer in September treatment with local steroid infection and oral prednisone taper and blood tests were checked.  Inflammatory markers were normal but rheumatoid factor and uric acid were significantly elevated.  Xray of the foot has shown soft tissue edema without significant osseus abnormalities.  After the injection and additional medication symptoms again improved at this point he is having persistent mild daily symptoms with some pain worsened with pressure and weight on the ball of his foot without much active swelling. He has had some chronic pain and instability of the right knee for a much longer time without any identified problem, xrays have been normal.  He does not drink much alcohol regularly.  He used to drink sodas very heavily but has already decreased this to regular but small amounts before any onset of the symptoms.   Labs reviewed 06/2021 ANA neg RF 125 Uric acid 11.1 ESR 6 CRP 3.0   Review of Systems  Constitutional: Negative.  Negative for fatigue.  HENT: Negative.  Negative for mouth sores and mouth dryness.   Eyes: Negative.  Negative for dryness.   Respiratory:  Positive for shortness of breath.   Cardiovascular: Negative.  Negative for chest pain and palpitations.  Gastrointestinal: Negative.  Negative for blood in stool, constipation and diarrhea.  Endocrine: Negative.  Negative for increased urination.  Genitourinary: Negative.  Negative for involuntary urination.  Musculoskeletal:  Positive for joint pain, joint pain, joint swelling and morning stiffness. Negative for gait problem, myalgias, muscle weakness, muscle tenderness and myalgias.  Skin: Negative.  Negative for pallor, rash, hair loss and sensitivity to sunlight.  Allergic/Immunologic: Negative.  Negative for susceptible to infections.  Neurological: Negative.  Negative for dizziness and headaches.  Hematological: Negative.  Negative for swollen glands.  Psychiatric/Behavioral:  Positive for sleep disturbance. Negative for depressed mood. The patient is not nervous/anxious.     PMFS History:  Patient Active Problem List   Diagnosis Date Noted   High risk medication use 12/31/2022   Dyslipidemia, goal LDL below 100 10/20/2022   Abnormal weight gain 09/03/2022  Other fatigue 09/03/2022   Abnormal fasting glucose 09/03/2022   Polyarticular gout 03/30/2022   Pain in left ankle and joints of left foot 02/27/2022   Effusion, right knee 02/07/2022   Inflammatory polyarthritis ([place]) 01/15/2022   Cellulitis of lower extremity 01/15/2022   Body mass index (BMI) of 31.0-31.9 in adult 01/15/2022   Acute non-recurrent pansinusitis 12/03/2021   Laryngitis 12/03/2021   BMI 40.0-44.9, adult ([place]) 12/03/2021   Hyperuricemia 08/21/2021   Encounter to establish care 05/14/2021   Right foot pain 05/14/2021   Obesity 07/13/2015   Mood disorder ([place]) 07/13/2015   ALLERGIC RHINITIS 06/06/2007   GERD 06/06/2007   IRRITABLE BOWEL SYNDROME 06/06/2007    Past Medical History:  Diagnosis Date   GERD (gastroesophageal reflux disease)    Hyperlipidemia    Inflammatory polyarthritis  ([place])     Family History  Problem Relation Age of Onset   Mental illness Mother    Drug abuse Mother    Diabetes Father    Alcohol abuse Father    Stroke Father    Alcohol abuse Maternal Uncle    Past Surgical History:  Procedure Laterality Date   TOE SURGERY Left    VASECTOMY     Social History   Social History Narrative   Not on file   Immunization History  Administered Date(s) Administered   Moderna Sars-Covid-2 Vaccination 11/04/2019, 12/03/2019     Objective: Vital Signs: BP 124/83 (BP Location: Left Arm, Patient Position: Sitting, Cuff Size: Large)   Pulse 65   Resp 18   Ht 6\' 1"  (1.854 m)   Wt (!) 312 lb (141.5 kg)   BMI 41.16 kg/m    Physical Exam Constitutional:      Appearance: He is obese.  Cardiovascular:     Rate and Rhythm: Normal rate and regular rhythm.  Pulmonary:     Effort: Pulmonary effort is normal.     Breath sounds: Normal breath sounds.  Skin:    General: Skin is warm and dry.  Neurological:     Mental Status: He is alert.  Psychiatric:        Mood and Affect: Mood normal.      Musculoskeletal Exam:  Shoulders full ROM no tenderness but posterior pain with external rotation especially in abducted position Elbows full ROM no tenderness or swelling Wrists full ROM no tenderness or swelling Fingers slightly limited ROM, pain around PIP joints on dorsal side with flexion but none to palpation Right knee effusion and warmth with decreased ROM   CDAI Exam: CDAI Score: 17  Patient Global: 50 mm; Provider Global: 20 mm Swollen: 1 ; Tender: 9  Joint Exam 12/31/2022      Right  Left  Glenohumeral   Tender   Tender  PIP 2      Tender  PIP 3   Tender   Tender  PIP 4      Tender  PIP 5   Tender   Tender  Knee  Swollen Tender        Investigation: No additional findings.  Imaging: No results found.  Recent Labs: Lab Results  Component Value Date   WBC 7.8 09/03/2022   HGB 14.7 09/03/2022   PLT 265 09/03/2022   NA 143  09/03/2022   K 4.4 09/03/2022   CL 105 09/03/2022   CO2 25 09/03/2022   GLUCOSE 72 09/03/2022   BUN 11 09/03/2022   CREATININE 1.01 09/03/2022   BILITOT 0.3 09/03/2022   ALKPHOS 79 09/03/2022  AST 17 09/03/2022   ALT 24 09/03/2022   PROT 6.0 09/03/2022   ALBUMIN 3.8 (L) 09/03/2022   CALCIUM 8.9 09/03/2022   GFRAA >60 06/24/2017    Speciality Comments: No specialty comments available.  Procedures:  No procedures performed Allergies: Bee venom   Assessment / Plan:     Visit Diagnoses: Inflammatory polyarthritis ([place]) - Plan: leflunomide (ARAVA) 20 MG tablet, C-reactive protein  Ongoing active seronegative rheumatoid arthritis currently seems to be in exacerbation due to the recent overuse and he is not on any steroid sparing DMARD.  Also recheck CRP which has previously been elevated during active inflammation.  Discussed alternative treatment options recommend we try leflunomide 20 mg daily.  Will wait until after his upcoming surgery and start at least 1 week later to minimize any perioperative risk.  With all the recent inflammation he has been on prednisone 20 mg most days hopefully can tolerate tapering off this after his knee surgery and with starting leflunomide.  Polyarticular gout - Plan: Uric acid  Initial treatment with urate lowering therapy but without any good clinical response and multiple repeat knee aspirations with no monosodium urate crystals identified.  Will recheck uric acid level make sure this is not excessively high again as partial contributor to joint inflammation.  High risk medication use - Plan: COMPLETE METABOLIC PANEL WITH GFR  Discussed starting leflunomide as alternative to the previous methotrexate will recheck baseline labs with complete metabolic panel.  He had recent blood count that was normal.  Discussed risk of medication including hepatotoxicity cytopenias also GI intolerance and increased blood pressure.  Effusion, right knee - Plan:  predniSONE (DELTASONE) 10 MG tablet  Ongoing knee pain and effusion will not repeat any procedure at this time with him having surgery scheduled in 1 week.  Hopefully to see more relief after this as it has been the primary issue preventing successful prednisone taper.  Orders: Orders Placed This Encounter  Procedures   Uric acid   COMPLETE METABOLIC PANEL WITH GFR   C-reactive protein   Meds ordered this encounter  Medications   leflunomide (ARAVA) 20 MG tablet    Sig: Take 1 tablet (20 mg total) by mouth daily.    Dispense:  30 tablet    Refill:  2   predniSONE (DELTASONE) 10 MG tablet    Sig: Take 1 tablet (10 mg total) by mouth daily with breakfast.    Dispense:  60 tablet    Refill:  2     Follow-Up Instructions: Return in about 11 weeks (around 03/18/2023) for RA/gout LEF start f/u 41mos.   Collier Salina, MD  Note - This record has been created using [employer].  Chart creation errors have been sought, but may not always  have been located. Such creation errors do not reflect on  the standard of medical care.

## 2022-12-31 NOTE — Patient Instructions (Signed)
Leflunomide Tablets What is this medication? LEFLUNOMIDE (le FLOO na mide) treats the symptoms of rheumatoid arthritis. It works by slowing down an overactive immune system. This decreases inflammation. It belongs to a group of medications called DMARDs. This medicine may be used for other purposes; ask your health care provider or pharmacist if you have questions. COMMON BRAND NAME(S): Arava What should I tell my care team before I take this medication? They need to know if you have any of these conditions: Cancer Diabetes High blood pressure Immune system problems Infection Kidney disease Liver disease Low blood cell levels (white cells, red cells, and platelets) Lung or breathing disease, such as asthma or COPD Recent or upcoming vaccine Skin conditions Tingling of the fingers or toes, or other nerve disorder An unusual or allergic reaction to leflunomide, other medications, food, dyes, or preservatives Pregnant or trying to get pregnant Breastfeeding How should I use this medication? Take this medication by mouth with a full glass of water. Take it as directed on the prescription label at the same time every day. Keep taking it unless your care team tells you to stop. Talk to your care team about the use of this medication in children. Special care may be needed. Overdosage: If you think you have taken too much of this medicine contact a poison control center or emergency room at once. NOTE: This medicine is only for you. Do not share this medicine with others. What if I miss a dose? If you miss a dose, take it as soon as you can. If it is almost time for your next dose, take only that dose. Do not take double or extra doses. What may interact with this medication? Do not take this medication with any of the following: Teriflunomide This medication may also interact with the following: Alosetron Caffeine Cefaclor Certain medications for diabetes, such as nateglinide,  repaglinide, rosiglitazone, pioglitazone Certain medications for high cholesterol, such as atorvastatin, pravastatin, rosuvastatin, simvastatin Charcoal Cholestyramine Ciprofloxacin Duloxetine Estrogen and progestin hormones Furosemide Ketoprofen Live virus vaccines Medications that increase your risk for infection Methotrexate Mitoxantrone Paclitaxel Penicillin Theophylline Tizanidine Warfarin This list may not describe all possible interactions. Give your health care provider a list of all the medicines, herbs, non-prescription drugs, or dietary supplements you use. Also tell them if you smoke, drink alcohol, or use illegal drugs. Some items may interact with your medicine. What should I watch for while using this medication? Visit your care team for regular checks on your progress. Tell your care team if your symptoms do not start to get better or if they get worse. You may need blood work done while you are taking this medication. This medication may cause serious skin reactions. They can happen weeks to months after starting the medication. Contact your care team right away if you notice fevers or flu-like symptoms with a rash. The rash may be red or purple and then turn into blisters or peeling of the skin. You may also notice a red rash with swelling of the face, lips, or lymph nodes in your neck or under your arms. You should not receive certain vaccines during your treatment and for a certain time after your treatment with this medication ends. Talk to your care team for more information. This medication may stay in your body for up to 2 years after your last dose. Tell your care team about any unusual side effects or symptoms. A medication can be given to help lower your blood levels of this  medication more quickly. Talk to your care team if you may be pregnant. This medication can cause serious birth defects if taken during pregnancy and for a while after the last dose. You will  need a negative pregnancy test before starting this medication. Contraception is recommended while taking this medication and for a while after the last dose. Your care team can help you find the option that works for you. Do not breastfeed while taking this medication. What side effects may I notice from receiving this medication? Side effects that you should report to your care team as soon as possible: Allergic reactions--skin rash, itching, hives, swelling of the face, lips, tongue, or throat Dry cough, shortness of breath or trouble breathing Increase in blood pressure Infection--fever, chills, cough, sore throat, wounds that don't heal, pain or trouble when passing urine, general feeling of discomfort or being unwell Redness, blistering, peeling, or loosening of the skin, including inside the mouth Liver injury--right upper belly pain, loss of appetite, nausea, light-colored stool, dark yellow or brown urine, yellowing skin or eyes, unusual weakness or fatigue Pain, tingling, or numbness in the hands or feet Unusual bruising or bleeding Side effects that usually do not require medical attention (report to your care team if they continue or are bothersome): Back pain Diarrhea Hair loss Headache Nausea This list may not describe all possible side effects. Call your doctor for medical advice about side effects. You may report side effects to FDA at 1-800-FDA-1088. Where should I keep my medication? Keep out of the reach of children and pets. Store at room temperature between 20 and 25 degrees C (68 and 77 degrees F). Protect from moisture and light. Keep the container tightly closed. Get rid of any unused medication after the expiration date. To get rid of medications that are no longer needed or have expired: Take the medication to a medication take-back program. Check with your pharmacy or law enforcement to find a location. If you cannot return the medication, ask your pharmacist or  care team how to get rid of this medication safely. NOTE: This sheet is a summary. It may not cover all possible information. If you have questions about this medicine, talk to your doctor, pharmacist, or health care provider.  2023 Elsevier/Gold Standard (2022-03-13 00:00:00)

## 2023-01-01 LAB — COMPLETE METABOLIC PANEL WITH GFR
AG Ratio: 1.4 (calc) (ref 1.0–2.5)
ALT: 24 U/L (ref 9–46)
AST: 18 U/L (ref 10–40)
Albumin: 4.2 g/dL (ref 3.6–5.1)
Alkaline phosphatase (APISO): 72 U/L (ref 36–130)
BUN: 14 mg/dL (ref 7–25)
CO2: 28 mmol/L (ref 20–32)
Calcium: 10 mg/dL (ref 8.6–10.3)
Chloride: 104 mmol/L (ref 98–110)
Creat: 0.99 mg/dL (ref 0.60–1.29)
Globulin: 3 g/dL (calc) (ref 1.9–3.7)
Glucose, Bld: 85 mg/dL (ref 65–99)
Potassium: 4.5 mmol/L (ref 3.5–5.3)
Sodium: 142 mmol/L (ref 135–146)
Total Bilirubin: 0.7 mg/dL (ref 0.2–1.2)
Total Protein: 7.2 g/dL (ref 6.1–8.1)
eGFR: 98 mL/min/{1.73_m2} (ref >=60)

## 2023-01-01 LAB — URIC ACID: Uric Acid, Serum: 6.7 mg/dL (ref 4.0–8.0)

## 2023-01-01 LAB — C-REACTIVE PROTEIN: CRP: 7.2 mg/L (ref <8.0)

## 2023-01-07 ENCOUNTER — Other Ambulatory Visit: Payer: Self-pay | Admitting: Orthopedic Surgery

## 2023-01-07 ENCOUNTER — Ambulatory Visit: Payer: BC Managed Care – PPO | Admitting: Internal Medicine

## 2023-01-07 DIAGNOSIS — X58XXXA Exposure to other specified factors, initial encounter: Secondary | ICD-10-CM | POA: Diagnosis not present

## 2023-01-07 DIAGNOSIS — S83232A Complex tear of medial meniscus, current injury, left knee, initial encounter: Secondary | ICD-10-CM | POA: Diagnosis not present

## 2023-01-07 DIAGNOSIS — M948X6 Other specified disorders of cartilage, lower leg: Secondary | ICD-10-CM | POA: Diagnosis not present

## 2023-01-07 DIAGNOSIS — M659 Synovitis and tenosynovitis, unspecified: Secondary | ICD-10-CM | POA: Diagnosis not present

## 2023-01-07 DIAGNOSIS — M6752 Plica syndrome, left knee: Secondary | ICD-10-CM | POA: Diagnosis not present

## 2023-01-07 DIAGNOSIS — M2242 Chondromalacia patellae, left knee: Secondary | ICD-10-CM | POA: Diagnosis not present

## 2023-01-07 DIAGNOSIS — Y999 Unspecified external cause status: Secondary | ICD-10-CM | POA: Diagnosis not present

## 2023-01-07 DIAGNOSIS — M65861 Other synovitis and tenosynovitis, right lower leg: Secondary | ICD-10-CM | POA: Diagnosis not present

## 2023-01-07 DIAGNOSIS — G8918 Other acute postprocedural pain: Secondary | ICD-10-CM | POA: Diagnosis not present

## 2023-01-07 DIAGNOSIS — M65162 Other infective (teno)synovitis, left knee: Secondary | ICD-10-CM | POA: Diagnosis not present

## 2023-01-10 ENCOUNTER — Encounter: Payer: Self-pay | Admitting: Family Medicine

## 2023-01-10 ENCOUNTER — Ambulatory Visit: Payer: BC Managed Care – PPO | Admitting: Family Medicine

## 2023-01-10 VITALS — BP 137/82 | HR 108 | Resp 18 | Ht 73.0 in | Wt 308.0 lb

## 2023-01-10 DIAGNOSIS — M2550 Pain in unspecified joint: Secondary | ICD-10-CM

## 2023-01-10 DIAGNOSIS — M791 Myalgia, unspecified site: Secondary | ICD-10-CM

## 2023-01-10 NOTE — Progress Notes (Signed)
Acute Office Visit  Subjective:     Patient ID: Thomas Schaefer, male    DOB: 29-Jul-1981, 42 y.o.   MRN: [id number]  Chief Complaint  Patient presents with   Generalized Body Aches   Sore Throat   Lymphadenopathy    HPI Patient is in today for body aches, sore throat/pain swallowing, possible swollen gland. Joint/Muscle Pain: Patient complains of arthralgias and myalgias for which has been present for 2 days. Pain is located in multiple joints/whole body, is described as aching and dull, and is constant .  Associated symptoms include: none.  The patient has tried NSAIDs for pain, with minimal relief.  Not related to injury.  Patient had surgery on his knee 3 days ago.  He has been taking prednisone for about 1 year for his knee pain prior to surgery.  He was taking 10 mg of prednisone twice a day every day.  It was recommended for him to taper off of it prior to surgery, but he forgot to taper it and abruptly discontinued the prednisone the day before the surgery.  His surgery went well and knee is recovering well, but the arthralgias and myalgias started the night of the surgery and the next day.  He was given hydrocodone postop and did take that at home but it did not help with the arthralgias or myalgias.  He also endorses some pain in his throat that that is particularly painful when swallowing.  It feels like there is a "swollen gland", denies fever, cough, rhinorrhea, sneezing.  Review of Systems  Constitutional:  Negative for chills, fever and malaise/fatigue.  HENT:  Positive for sore throat. Negative for congestion and ear pain.   Eyes:  Negative for blurred vision and double vision.  Respiratory:  Negative for shortness of breath.   Cardiovascular:  Negative for chest pain and palpitations.  Gastrointestinal:  Negative for constipation, diarrhea, nausea and vomiting.  Musculoskeletal:  Positive for joint pain and myalgias. Negative for falls.  Neurological:  Negative for  dizziness and headaches.  Endo/Heme/Allergies:  Does not bruise/bleed easily.     Objective:    BP 137/82 (BP Location: Left Arm, Patient Position: Sitting, Cuff Size: Large)   Pulse (!) 108   Resp 18   Ht '6\' 1"'$  (1.854 m)   Wt (!) 308 lb (139.7 kg)   SpO2 95%   BMI 40.64 kg/m   Physical Exam Constitutional:      General: He is not in acute distress.    Appearance: Normal appearance.  HENT:     Head: Normocephalic and atraumatic.     Mouth/Throat:     Mouth: Mucous membranes are moist. No oral lesions.     Pharynx: Oropharynx is clear. Uvula midline. No pharyngeal swelling, oropharyngeal exudate, posterior oropharyngeal erythema or uvula swelling.  Eyes:     Conjunctiva/sclera: Conjunctivae normal.  Neck:     Thyroid: No thyromegaly.  Cardiovascular:     Rate and Rhythm: Normal rate and regular rhythm.     Pulses: Normal pulses.     Heart sounds: Normal heart sounds.  Pulmonary:     Effort: Pulmonary effort is normal.     Breath sounds: Normal breath sounds.  Musculoskeletal:     Cervical back: Neck supple.  Lymphadenopathy:     Cervical: No cervical adenopathy.  Skin:    General: Skin is warm and dry.  Neurological:     Mental Status: He is alert and oriented to person, place, and time.  Psychiatric:  Mood and Affect: Mood normal.      Assessment & Plan:  Myalgia  Arthralgia, unspecified joint  Myalgias and arthralgias most likely secondary to abrupt withdrawal of steroids.  We discussed the importance of tapering off of steroids and management options.  Opted to try alternating ibuprofen and Tylenol every 3-4 hours while not exceeding maximum daily dose of ibuprofen 2400 mg and maximum daily dose of Tylenol of 4000 mg.  We discussed that if this is not effective, then we will restart the steroids and properly taper them off.  Patient verbalized understanding and will message if things get worse.  He is also going to be here with his father next week, and we  can check in then as well.  Return if symptoms worsen or fail to improve.  Velva Harman, PA

## 2023-01-10 NOTE — Patient Instructions (Addendum)
Alternate taking ibuprofen and acetaminophen (Tylenol) every 4 hours.  You can take up to 2400 mg total of ibuprofen per day and up to 4000 mg total of Tylenol every day.  This will help with some of the rebound inflammation from stopping the steroids suddenly that is causing your joint and muscle aches and pains.  If it is not improving 5 to 7 days from now, then we may consider restarting a lower dose of steroids to taper you off.

## 2023-01-14 ENCOUNTER — Telehealth: Payer: Self-pay | Admitting: *Deleted

## 2023-01-14 DIAGNOSIS — M25461 Effusion, right knee: Secondary | ICD-10-CM

## 2023-01-14 MED ORDER — PREDNISONE 10 MG PO TABS: 5.0000 mg | ORAL_TABLET | Freq: Every day | ORAL | Status: DC

## 2023-01-14 NOTE — Telephone Encounter (Signed)
I spoke with Thomas Schaefer he stopped prednisone starting since the day of his right knee surgery until now.  1 day after surgery started feeling generalized body aches and fatigue.  He is also noticed feeling very cold more easily than usual though this has been a problem since starting prednisone.  He is slightly less achy now compared to a week ago but still feels very bad especially in the morning and when lying down at night.  He spoke with PCP who recommended that with more than a week since stopping prednisone he could hopefully stay off the medication versus a recommended taper.  He is not seeing much visible joint swelling.  He did start on the leflunomide medicine as planned after surgery did not notice any particular intolerance or change with this so far.  I do not think he is at significant risk of adrenal crisis or severe insufficiency or would have had worse problems already by now.  However with change in his rheumatoid arthritis treatment leflunomide is unlikely to control symptoms well until a longer treatment duration.  To treat both probably steroid withdrawal symptoms and his RA recommend decreasing dose to prednisone 5 mg daily for the next 2 weeks before trying to discontinue completely.  He can call back if does not see benefit with restarting the medicine at low-dose or when trying to taper off again.

## 2023-01-14 NOTE — Addendum Note (Signed)
Addended by: Collier Salina on: 01/14/2023 04:11 PM   Modules accepted: Orders

## 2023-01-14 NOTE — Telephone Encounter (Signed)
Patient contacted the office and states he had surgery a week ago. Patient states he has been on Prednisone. Patient states he abruptly stopped the prednisone the day of the surgery. Patient states he has not taken any for a week. Patient states he is now experiencing pain and body aches. Patient states he did see his PCP who thinks that this may be withdrawal from the Prednisone. Patient would like to know what he should do. Should he "try to tough it out through the body aches"? Does he need to start taking the prednisone again? Please advise.

## 2023-01-15 DIAGNOSIS — M25461 Effusion, right knee: Secondary | ICD-10-CM | POA: Diagnosis not present

## 2023-01-16 ENCOUNTER — Telehealth: Payer: Self-pay

## 2023-01-16 NOTE — Telephone Encounter (Signed)
Patient contacted the office and inquired about Dr.Rice writing him a note to be out of work for another week. Patient states he has general weakness. Contacted the patient to acquire the exact dates in which he needs to be out of work. Patient stated after talking with his boss, he no longer needs the doctor's note to be out of work.

## 2023-01-23 ENCOUNTER — Ambulatory Visit: Payer: BC Managed Care – PPO | Attending: Internal Medicine | Admitting: Internal Medicine

## 2023-01-23 ENCOUNTER — Telehealth: Payer: Self-pay | Admitting: Pharmacist

## 2023-01-23 ENCOUNTER — Encounter: Payer: Self-pay | Admitting: Internal Medicine

## 2023-01-23 ENCOUNTER — Telehealth: Payer: Self-pay | Admitting: *Deleted

## 2023-01-23 VITALS — BP 127/78 | HR 67 | Resp 14 | Ht 73.0 in | Wt 306.0 lb

## 2023-01-23 DIAGNOSIS — Z79899 Other long term (current) drug therapy: Secondary | ICD-10-CM | POA: Diagnosis not present

## 2023-01-23 DIAGNOSIS — M109 Gout, unspecified: Secondary | ICD-10-CM | POA: Diagnosis not present

## 2023-01-23 DIAGNOSIS — M0600 Rheumatoid arthritis without rheumatoid factor, unspecified site: Secondary | ICD-10-CM | POA: Diagnosis not present

## 2023-01-23 NOTE — Patient Instructions (Signed)
Etanercept Injection What is this medication? ETANERCEPT (et a NER sept) treats autoimmune conditions, such as psoriasis and certain types of arthritis. It works by slowing down an overactive immune system. It belongs to a group of medications called TNF inhibitors. This medicine may be used for other purposes; ask your health care provider or pharmacist if you have questions. COMMON BRAND NAME(S): Enbrel What should I tell my care team before I take this medication? They need to know if you have any of these conditions: Bleeding disorder Cancer Diabetes Granulomatosis with polyangiitis Heart failure HIV or AIDS Immune system problems Infection, such as tuberculosis (TB) or other bacterial, fungal or viral infections Liver disease Nervous system problems, such as Guillain-Barre syndrome, multiple sclerosis or seizures Recent or upcoming vaccine An unusual or allergic reaction to etanercept, other medications, food, dyes, or preservatives Pregnant or trying to get pregnant Breastfeeding How should I use this medication? The medication is injected under the skin. You will be taught how to prepare and give it. Take it as directed on the prescription label. Keep taking it unless your care team tells you stop. This medication comes with INSTRUCTIONS FOR USE. Ask your pharmacist for directions on how to use this medication. Read the information carefully. Talk to your pharmacist or care team if you have questions. If you use a pen, be sure to take off the outer needle cover before using the dose. It is important that you put your used needles and syringes in a special sharps container. Do not put them in a trash can. If you do not have a sharps container, call your pharmacist or care team to get one. A special MedGuide will be given to you by the pharmacist with each prescription and refill. Be sure to read this information carefully each time. Talk to your care team about the use of this  medication in children. While it may be prescribed for children as young as 2 years of age for selected conditions, precautions do apply. Overdosage: If you think you have taken too much of this medicine contact a poison control center or emergency room at once. NOTE: This medicine is only for you. Do not share this medicine with others. What if I miss a dose? If you miss a dose, take it as soon as you can. If it is almost time for your next dose, take only that dose. Do not take double or extra doses. What may interact with this medication? Do not take this medication with any of the following: Biologic medications, such as adalimumab, certolizumab, golimumab, infliximab Live vaccines Rilonacept This medication may also interact with the following: Abatacept [place] Biologic medications, such as anifrolumab, baricitinib, belimumab, canakinumab, natalizumab, rituximab, sarilumab, tocilizumab, tofacitinib, upadacitinib, vedolizumab Cyclophosphamide Sulfasalazine This list may not describe all possible interactions. Give your health care provider a list of all the medicines, herbs, non-prescription drugs, or dietary supplements you use. Also tell them if you smoke, drink alcohol, or use illegal drugs. Some items may interact with your medicine. What should I watch for while using this medication? Visit your care team for regular checks on your progress. Tell your care team if your symptoms do not start to get better or if they get worse. This medication may increase your risk of getting an infection. Call your care team for advice if you get a fever, chills, sore throat, or other symptoms of a cold or flu. Do not treat yourself. Try to avoid being around people who are sick. If   you have not had the measles or chickenpox vaccines, tell your care team right away if you are around someone with these viruses. You will be tested for tuberculosis (TB) before you start this medication. If your care team  prescribes any medication for TB, you should start taking the TB medication before starting this medication. Make sure to finish the full course of TB medication. Avoid taking medications that contain aspirin, acetaminophen, ibuprofen, naproxen, or ketoprofen unless instructed by your care team. These medications may hide fever. Talk to your care team about your risk of cancer. You may be more at risk for certain types of cancer if you take this medication. This medication can decrease the response to a vaccine. If you need to get vaccinated, tell your care team if you have received this medication. Extra booster doses may be needed. Talk to your care team to see if a different vaccination schedule is needed. What side effects may I notice from receiving this medication? Side effects that you should report to your care team as soon as possible: Allergic reactions--skin rash, itching, hives, swelling of the face, lips, tongue, or throat Body pain, tingling, or numbness Eye pain, change in vision, vision loss Heart failure--shortness of breath, swelling of the ankles, feet, or hands, sudden weight gain, unusual weakness or fatigue Infection--fever, chills, cough, sore throat, wounds that don't heal, pain or trouble when passing urine, general feeling of discomfort or being unwell Liver injury--right upper belly pain, loss of appetite, nausea, light-colored stool, dark yellow or brown urine, yellowing skin or eyes, unusual weakness or fatigue Low red blood cell level--unusual weakness or fatigue, dizziness, headache, trouble breathing Lupus-like syndrome--joint pain, swelling, or stiffness, butterfly-shaped rash on the face, rashes that get worse in the sun, fever, unusual weakness or fatigue New or worsening psoriasis--rash with itchy, scaly patches Seizures Unusual bruising or bleeding Weakness in arms and legs Side effects that usually do not require medical attention (report to your care team if  they continue or are bothersome): Headache Pain, redness, or irritation at injection site Sinus pain or pressure around the face or forehead This list may not describe all possible side effects. Call your doctor for medical advice about side effects. You may report side effects to FDA at 1-800-FDA-1088. Where should I keep my medication? Keep out of the reach of children and pets. See product for storage information. Each product may have different instructions. Get rid of any unused medication after the expiration date. To get rid of medications that are no longer needed or have expired: Take the medication to a medication take-back program. Check with your pharmacy or law enforcement to find a location. If you cannot return the medication, ask your pharmacist or care team how to get rid of this medication safely. NOTE: This sheet is a summary. It may not cover all possible information. If you have questions about this medicine, talk to your doctor, pharmacist, or health care provider.  2023 [employer]/Gold Standard (2022-04-11 00:00:00)  

## 2023-01-23 NOTE — Progress Notes (Unsigned)
Office Visit Note  Patient: Thomas Schaefer             Date of Birth: June 09, 1981           MRN: [id number]             PCP: Ronnell Freshwater, NP Referring: Ronnell Freshwater, NP Visit Date: 01/23/2023   Subjective:  Follow-up   History of Present Illness: Thomas Schaefer is a 42 y.o. male here for follow up for seronegative rheumatoid arthritis on leflunomide 20 mg daily.  Since last visit he had right knee arthroscopic surgery with some debridement of extensive synovial hypertrophy and some loose cartilage bodies.  Afterwards he discontinued prednisone completely and after about 4 days started feeling generalized worsening of joint pain and stiffness and severe fatigue.  Initially tried managing symptoms with ibuprofen 800 mg after calling us I recommended resuming a lower dose prednisone and gradually tapering it off at 2-week increment.  So he is back to taking 5 mg twice daily right now and ibuprofen 800 mg every 6 hours and has been out of work for the past 2 weeks.  He is not having trouble taking the leflunomide but has not seen any improvement yet.  His right knee actually feels well and everywhere else is more painful and inflamed.  Has bilateral hand swelling.  Notices weakness inability to grip and lift strongly with his arms.  Also bilateral shoulder pain especially with overhead abduction and lying on his side in bed.  3/11 Right knee surgical pathology Chronically inflamed synovium with degenerative changes  Previous HPI 12/31/22 Thomas Schaefer is a 42 y.o. male here for follow up for RA and gout currently on prednisone 10 mg daily.  Right now he is in a symptom exacerbation doing much worse had a flareup after an exercise training session last week.  He is having pain in his right knee which has been the main ongoing issue also having more trouble with his shoulders right now with pain worse posteriorly especially feels increased pain when lying on his side in bed.  He has had  some continued weight gain with the long-term prednisone use starting phentermine for weight loss has not seen any progress so far.  He is scheduled for right knee surgery coming up in 1 week.   Previous HPI 08/27/22 Thomas Schaefer is a 42 y.o. male here for follow up for RA and gout currently on prednisone 10 mg daily. He stopped taking methotrexate due to running out of prescribed tablets after about 1 month. He saw [facility] for follow up with repeat aspiration and injection in right knee and symptoms stayed better for a month or more before swelling and pain got worse again. No new flare ups anywhere else. He discussed possibility of arthroscopy early next year due to the chronic symptoms.   Previous HPI 06/04/22 Thomas Schaefer is a 42 y.o. male here for follow up joint inflammation. Since our last visit he saw Dr. Doreatha Martin and MRI of right knee obtained showing changes of synovitis and probable ruptured baker's cyst. He has had a steroid injection of the left knee and the left leg is doing well. Hand pain and stiffness are also doing very well mostly just has the knee pain. He started methotrexate since 2 weeks ago tolerating it okay. He decreased prednisone down to 10 mg once daily from twice for a week and is tolerating this okay his knee swelling increased some but not very painful.  Previous HPI 03/30/2022 Thomas Schaefer is a 42 y.o. male here for follow up for joint inflammation. Since our last visit he completed the prednisone his pain was partially improved on this but quickly returned off the medication. Swelling never resolved. He now has increased symptoms involving his hands with decreased grip strength and mobility. He notices increasing pain in left knee and right ankle he thinks is compensating for his worse affected joints.   Previous HPI 03/14/2022 Thomas Schaefer is a 42 y.o. male here for follow up for joint inflammation.  Shortly after completing most recent steroids  he has redeveloped increased swelling of the right knee and swelling and pain in the left ankle.  He noticed the most recent steroid treatment improved his swelling but the left ankle pain was not dramatically improved. So far he has not seen an obvious difference in swelling while taking the sulfasalazine.   Previous HPI 02/27/2022 Thomas Schaefer is a 42 y.o. male here for follow up for joint inflammation. His knee improved greatly after aspiration and with prednisone taper but since 4 days ago left ankle pain and inflammation started. Pain is worst first thing in the morning and when standing up gets partially better when up and walking around. He is not taking anything for this currently    Previous HPI 02/07/22 Thomas Schaefer is a 41 y.o. male here for follow up for joint inflammation previously seen in October initial plan to treat gouty arthritis not clear if also having RA with a positive RF. He has suffered multiple and persistent joint inflammation treated with colchicine and multiple prednisone courses. He started taking allopurinol last month and within days developed severe pain and stiffness in his right knee and some pain extending up to the right hip posteriorly. Pain is intermittent and stabbing, and he cannot fully extend his knee. Symptoms are not similar to anything that he had in the past. He took 2 12 day prednisone tapers these improved symptoms but came back again once down to about 20 mg daily dose. He stopped taking colchicine in accordance with directions when prescribed the prednisone.   Previous HPI 08/21/21 Thomas Schaefer is a 42 y.o. male here for foot pain and inflammation with elevated rheumatoid factor and uric acid.  Symptoms started since around July of this year with pain on the right foot worse at the bottom of the foot and the smaller for toes. Initial treatment with colchicine had partial benefit in symptoms.  There is also considerable initial swelling.  He  received a short course of oral steroids with more symptom improvement but this persisted. He saw Dr. Milinda Pointer in September treatment with local steroid infection and oral prednisone taper and blood tests were checked.  Inflammatory markers were normal but rheumatoid factor and uric acid were significantly elevated.  Xray of the foot has shown soft tissue edema without significant osseus abnormalities.  After the injection and additional medication symptoms again improved at this point he is having persistent mild daily symptoms with some pain worsened with pressure and weight on the ball of his foot without much active swelling. He has had some chronic pain and instability of the right knee for a much longer time without any identified problem, xrays have been normal.  He does not drink much alcohol regularly.  He used to drink sodas very heavily but has already decreased this to regular but small amounts before any onset of the symptoms.   Labs reviewed 06/2021 ANA neg RF 125 Uric  acid 11.1 ESR 6 CRP 3.0   Review of Systems  Constitutional:  Positive for fatigue.  HENT:  Negative for mouth sores and mouth dryness.   Eyes:  Negative for dryness.  Respiratory:  Negative for shortness of breath.   Cardiovascular:  Negative for chest pain and palpitations.  Gastrointestinal:  Positive for diarrhea. Negative for blood in stool and constipation.  Endocrine: Positive for increased urination.  Genitourinary:  Negative for involuntary urination.  Musculoskeletal:  Positive for joint pain, gait problem, joint pain, joint swelling, myalgias, muscle weakness, morning stiffness, muscle tenderness and myalgias.  Skin:  Positive for color change. Negative for rash, hair loss and sensitivity to sunlight.  Allergic/Immunologic: Negative for susceptible to infections.  Neurological:  Negative for dizziness and headaches.  Hematological:  Positive for swollen glands.  Psychiatric/Behavioral:  Positive for sleep  disturbance. Negative for depressed mood. The patient is not nervous/anxious.     PMFS History:  Patient Active Problem List   Diagnosis Date Noted   High risk medication use 12/31/2022   Dyslipidemia, goal LDL below 100 10/20/2022   Other fatigue 09/03/2022   Abnormal fasting glucose 09/03/2022   Polyarticular gout 03/30/2022   Seronegative rheumatoid arthritis ([place]) 01/15/2022   Body mass index (BMI) of 31.0-31.9 in adult 01/15/2022   BMI 40.0-44.9, adult ([place]) 12/03/2021   Hyperuricemia 08/21/2021   Obesity 07/13/2015   Mood disorder ([place]) 07/13/2015   ALLERGIC RHINITIS 06/06/2007   GERD 06/06/2007   IRRITABLE BOWEL SYNDROME 06/06/2007    Past Medical History:  Diagnosis Date   GERD (gastroesophageal reflux disease)    Hyperlipidemia    Inflammatory polyarthritis ([place])     Family History  Problem Relation Age of Onset   Mental illness Mother    Drug abuse Mother    Diabetes Father    Alcohol abuse Father    Stroke Father    Alcohol abuse Maternal Uncle    Past Surgical History:  Procedure Laterality Date   KNEE SURGERY Right    TOE SURGERY Left    VASECTOMY     Social History   Social History Narrative   Not on file   Immunization History  Administered Date(s) Administered   Moderna Sars-Covid-2 Vaccination 11/04/2019, 12/03/2019     Objective: Vital Signs: BP 127/78 (BP Location: Left Arm, Patient Position: Sitting, Cuff Size: Normal)   Pulse 67   Resp 14   Ht 6\' 1"  (1.854 m)   Wt (!) 306 lb (138.8 kg)   BMI 40.37 kg/m    Physical Exam Constitutional:      Appearance: He is obese.  Eyes:     Conjunctiva/sclera: Conjunctivae normal.  Cardiovascular:     Rate and Rhythm: Normal rate and regular rhythm.  Pulmonary:     Effort: Pulmonary effort is normal.     Breath sounds: Normal breath sounds.  Lymphadenopathy:     Cervical: No cervical adenopathy.  Skin:    General: Skin is warm and dry.  Neurological:     Mental Status: He is alert.       Musculoskeletal Exam:  Bilateral shoulder pain with overhead abduction worse with active movement and with passive, painful arc, some tenderness to pressure on lateral side of the joint no palpable swelling or warmth Elbows full ROM left elbow tenderness to pressure without palpable effusion Wrists full ROM no tenderness or swelling Soft tissue swelling on right hand MCP joints there is diffuse tenderness to pressure throughout MCP and PIPs of both hands with slightly  limited grip range of motion Right knee trace swelling present without much tenderness to pressure, left knee mildly painful small swelling present range of motion intact   CDAI Exam: CDAI Score: 38  Patient Global: 80 mm; Provider Global: 40 mm Swollen: 6 ; Tender: 20  Joint Exam 01/23/2023      Right  Left  Glenohumeral   Tender   Tender  Elbow      Tender  MCP 2  Swollen Tender   Tender  MCP 3  Swollen Tender   Tender  MCP 4  Swollen Tender   Tender  MCP 5  Swollen Tender   Tender  PIP 2   Tender   Tender  PIP 3   Tender   Tender  PIP 4   Tender   Tender  PIP 5   Tender   Tender  Knee  Swollen   Swollen Tender     Investigation: No additional findings.  Imaging: No results found.  Recent Labs: Lab Results  Component Value Date   WBC 7.8 09/03/2022   HGB 14.7 09/03/2022   PLT 265 09/03/2022   NA 142 12/31/2022   K 4.5 12/31/2022   CL 104 12/31/2022   CO2 28 12/31/2022   GLUCOSE 85 12/31/2022   BUN 14 12/31/2022   CREATININE 0.99 12/31/2022   BILITOT 0.7 12/31/2022   ALKPHOS 79 09/03/2022   AST 18 12/31/2022   ALT 24 12/31/2022   PROT 7.2 12/31/2022   ALBUMIN 3.8 (L) 09/03/2022   CALCIUM 10.0 12/31/2022   GFRAA >60 06/24/2017    Speciality Comments: No specialty comments available.  Procedures:  No procedures performed Allergies: Bee venom   Assessment / Plan:     Visit Diagnoses: Seronegative rheumatoid arthritis ([place])  Appears to currently be in a flareup not sure if this is due  to reducing prednisone treatment or just postoperative inflammatory event.  Definitely no apparent improvement since starting leflunomide.  Recommend we start with biologic DMARD due to very prolonged disease activity and inability to tolerate prednisone tapering on oral small molecule.  Discussed starting Enbrel 50 mg subcu weekly as monotherapy can discontinue the leflunomide with no appreciable response.  Polyarticular gout  No evidence of tophaceous deposit or obvious crystalline arthropathy on recent knee surgery along with lack of symptom response to uric acid at goal does not seem to be the active problem causing his joint inflammation.  High risk medication use  Discussed risk for starting Enbrel including injection site reactions, allergic reaction, infections, malignancy, cytopenias hepatotoxicity.  He had recent blood count metabolic panel testing and previous negative hepatitis screening. Does not have any history of recurrent infection problems or congestive heart failure. He reports getting annual tuberculosis screening test for his occupation with most recent 1 in February.  He agrees to take a picture and send digital record of this or a paper copy of his documentation.  Orders: No orders of the defined types were placed in this encounter.  No orders of the defined types were placed in this encounter.    Follow-Up Instructions: No follow-ups on file.   Collier [place], MD  Note - This record has been created using [employer].  Chart creation errors have been sought, but may not always  have been located. Such creation errors do not reflect on  the standard of medical care.

## 2023-01-23 NOTE — Telephone Encounter (Signed)
Patient will bring TB Skin Test on 11/2022 from work to office before starting Enbrel.

## 2023-01-23 NOTE — Telephone Encounter (Addendum)
Pending OV note from 01/23/23, please start Enbrel BIV  Knox Saliva, PharmD, MPH, BCPS, CPP Clinical Pharmacist (Rheumatology and Pulmonology)  ----- Message from Shona Needles, RT sent at 01/23/2023  3:21 PM EDT ----- Regarding: NEW START ENBREL Patient will bring TB Skin test to office that he had at his work 11/2022.

## 2023-01-24 NOTE — Telephone Encounter (Signed)
Submitted a Prior Authorization request to [facility] for ENBREL via CoverMyMeds. Will update once we receive a response.  Key: [id number]  Knox Saliva, PharmD, MPH, BCPS, CPP Clinical Pharmacist (Rheumatology and Pulmonology)

## 2023-01-28 ENCOUNTER — Other Ambulatory Visit (HOSPITAL_COMMUNITY): Payer: Self-pay

## 2023-01-28 NOTE — Telephone Encounter (Signed)
Received notification from [facility] regarding a prior authorization for ENBREL. Authorization has been APPROVED from 01/25/23 with no end date or start date noted.  Approval letter not yet received.  Per test claim, copay for 28 days supply is $100  Patient can fill through [place]: [phone]   Authorization # [id number]  MyChart message sent to pt for TB skin test result  Knox Saliva, PharmD, MPH, BCPS, CPP Clinical Pharmacist (Rheumatology and Pulmonology)

## 2023-01-31 ENCOUNTER — Ambulatory Visit: Payer: BC Managed Care – PPO | Admitting: Nurse Practitioner

## 2023-02-04 NOTE — Telephone Encounter (Signed)
TB skin test result received. Negative on 12/03/22  Enrolled patient into copay card for Enbrel RxBIN: [id number] PCN: [id number] Group: [id number] ID: [id number]  Physical copy of copay card will be mailed to home in 2-3 business days  Patient is scheduled for Enbrel new start on 02/05/23  Chesley Mires, PharmD, MPH, BCPS, CPP Clinical Pharmacist (Rheumatology and Pulmonology)

## 2023-02-05 ENCOUNTER — Other Ambulatory Visit: Payer: Self-pay | Admitting: Internal Medicine

## 2023-02-05 ENCOUNTER — Ambulatory Visit: Payer: BC Managed Care – PPO | Attending: Internal Medicine | Admitting: Pharmacist

## 2023-02-05 ENCOUNTER — Other Ambulatory Visit: Payer: Self-pay

## 2023-02-05 ENCOUNTER — Other Ambulatory Visit (HOSPITAL_COMMUNITY): Payer: Self-pay

## 2023-02-05 DIAGNOSIS — M25461 Effusion, right knee: Secondary | ICD-10-CM | POA: Diagnosis not present

## 2023-02-05 DIAGNOSIS — Z7189 Other specified counseling: Secondary | ICD-10-CM

## 2023-02-05 DIAGNOSIS — M0600 Rheumatoid arthritis without rheumatoid factor, unspecified site: Secondary | ICD-10-CM

## 2023-02-05 DIAGNOSIS — Z79899 Other long term (current) drug therapy: Secondary | ICD-10-CM

## 2023-02-05 MED ORDER — ENBREL MINI 50 MG/ML ~~LOC~~ SOCT
50.0000 mg | SUBCUTANEOUS | 0 refills | Status: DC
  Filled 2023-02-05: qty 12, fill #0
  Filled 2023-02-06: qty 4, 28d supply, fill #0
  Filled 2023-02-27: qty 4, 28d supply, fill #1
  Filled 2023-03-29: qty 4, 28d supply, fill #2

## 2023-02-05 MED ORDER — PREDNISONE 5 MG PO TABS: ORAL_TABLET | ORAL | 0 refills | Status: DC

## 2023-02-05 NOTE — Patient Instructions (Addendum)
Your next ENBREL dose is due on 02/12/23, 02/19/23, and every 7 days thereafter  CONTINUE prednisone 5mg  twice daily for 1 month then prednisone 5mg  daily.  DISCONTINUE leflunomide ([place])  HOLD ENBREL if you have signs or symptoms of an infection. You can resume once you feel better or back to your baseline. HOLD ENBREL if you start antibiotics to treat an infection. HOLD ENBREL around the time of surgery/procedures. Your surgeon will be able to provide recommendations on when to hold BEFORE and when you are cleared to RESUME.  Pharmacy information: Your prescription will be shipped from [facility]. [facility] (patient advocate) will call you to schedule the first shipment to your home. The pharmacy will call you to set up subsequent refills. Their phone number is [phone] They will mail your medication to your home.  Labs are due in 1 month then every 3 months. Lab hours are from Monday to Thursday 8am-12:30pm and 1pm-5pm and Friday 8am-12pm. You do not need an appointment if you come for labs during these times.  How to manage an injection site reaction: Remember the 5 C's: COUNTER - leave on the counter at least 30 minutes but up to overnight to bring medication to room temperature. This may help prevent stinging COLD - place something cold (like an ice gel pack or cold water bottle) on the injection site just before cleansing with alcohol. This may help reduce pain CLARITIN - use Claritin (generic name is loratadine) for the first two weeks of treatment or the day of, the day before, and the day after injecting. This will help to minimize injection site reactions CORTISONE CREAM - apply if injection site is irritated and itching CALL ME - if injection site reaction is bigger than the size of your fist, looks infected, blisters, or if you develop hives

## 2023-02-05 NOTE — Progress Notes (Signed)
Pharmacy Note  Subjective:   Patient presents to clinic today to receive first dose of Enbrel for seronegative rheumatoid arthritis. Patient previously took leflunomide with no response which is still taking for now. Is currently on prednisone  Patient running a fever or have signs/symptoms of infection? No  Patient currently on antibiotics for the treatment of infection? No  Patient have any upcoming invasive procedures/surgeries? No  Objective: CMP     Component Value Date/Time   NA 142 12/31/2022 1349   NA 143 09/03/2022 0904   K 4.5 12/31/2022 1349   CL 104 12/31/2022 1349   CO2 28 12/31/2022 1349   GLUCOSE 85 12/31/2022 1349   GLUCOSE 96 10/01/2006 0740   BUN 14 12/31/2022 1349   BUN 11 09/03/2022 0904   CREATININE 0.99 12/31/2022 1349   CALCIUM 10.0 12/31/2022 1349   PROT 7.2 12/31/2022 1349   PROT 6.0 09/03/2022 0904   ALBUMIN 3.8 (L) 09/03/2022 0904   AST 18 12/31/2022 1349   ALT 24 12/31/2022 1349   ALKPHOS 79 09/03/2022 0904   BILITOT 0.7 12/31/2022 1349   BILITOT 0.3 09/03/2022 0904   GFRNONAA >60 06/24/2017 1158   GFRAA >60 06/24/2017 1158    CBC    Component Value Date/Time   WBC 7.8 09/03/2022 0904   WBC 7.0 06/04/2022 1601   RBC 5.06 09/03/2022 0904   RBC 5.10 06/04/2022 1601   HGB 14.7 09/03/2022 0904   HCT 44.3 09/03/2022 0904   PLT 265 09/03/2022 0904   MCV 88 09/03/2022 0904   MCH 29.1 09/03/2022 0904   MCH 29.0 06/04/2022 1601   MCHC 33.2 09/03/2022 0904   MCHC 34.2 06/04/2022 1601   RDW 13.0 09/03/2022 0904   LYMPHSABS 1,407 06/04/2022 1601   MONOABS 0.5 06/24/2017 1158   EOSABS 91 06/04/2022 1601   BASOSABS 63 06/04/2022 1601    Baseline Immunosuppressant Therapy Labs TB skin test negative on 12/03/22 (in media tab of chart)  Hepatitis Panel    Latest Ref Rng & Units 03/30/2022   10:20 AM  Hepatitis  Hep B Surface Ag NON-REACTIVE NON-REACTIVE   Hep B IgM NON-REACTIVE NON-REACTIVE   Hep C Ab NON-REACTIVE NON-REACTIVE    SPEP     Latest Ref Rng & Units 12/31/2022    1:49 PM  Serum Protein Electrophoresis  Total Protein 6.1 - 8.1 g/dL 7.2    Assessment/Plan:  Reviewed importance of holding ENBREL with signs/symptoms of an infections, if antibiotics are prescribed to treat an active infection, and with invasive procedures  Demonstrated proper injection technique with ENBREL MINI and Autotouch injector demo device  Patient able to demonstrate proper injection technique using the teach back method.  Patient self injected in the right lower abdomen with:  Sample Medication: Enbrel Mini cartridge 50mg /mL NDC: [address]-50 Lot: [id number] Expiration: 06/28/2024  Sample Medication: Enbrel Autotouch reusable injector device (pt to take home) NDC: [address]-05 Lot: [id number] Expiration: 07/28/2025  Patient tolerated well.  Observed for 30 mins in office for adverse reaction and none noted.   Patient is to return in 1 month for labs and 6-8 weeks for follow-up appointment.  Standing orders for CBC/CMP.  TB gold will be monitored yearly. Referral to [facility] placed today for yearly skin checks while on TNF inhibitor due to risk for non melanoma skin cancer  ENBREL approved through insurance .   Rx sent to: [facility]: [phone] .  Patient provided with pharmacy phone number and advised that [facility] will reach  out to schedule first shipment to his home. Subsequent refills will be set up pharmacy staff.  Patient will continue Enbrel 50mg  subcut every 7 days. He will continue prednisone 5mg  twice daily x 1 month then 5mg  daily thereafter. He will discontinue leflunomide since he has had no clinical improvement since initiation of this.  All questions encouraged and answered.  Instructed patient to call with any further questions or concerns.  Chesley Mires, PharmD, MPH, BCPS, CPP Clinical Pharmacist (Rheumatology and Pulmonology)  02/05/2023 8:08 AM

## 2023-02-06 ENCOUNTER — Other Ambulatory Visit (HOSPITAL_COMMUNITY): Payer: Self-pay

## 2023-02-06 ENCOUNTER — Other Ambulatory Visit: Payer: Self-pay

## 2023-02-27 ENCOUNTER — Other Ambulatory Visit (HOSPITAL_COMMUNITY): Payer: Self-pay

## 2023-03-04 ENCOUNTER — Other Ambulatory Visit: Payer: Self-pay

## 2023-03-05 NOTE — Progress Notes (Unsigned)
Office Visit Note  Patient: Thomas Schaefer             Date of Birth: 26-Oct-1981           MRN: [id number]             PCP: Carlean Jews, NP Referring: Carlean Jews, NP Visit Date: 03/06/2023   Subjective:  No chief complaint on file.   History of Present Illness: Thomas Schaefer is a 42 y.o. male here for follow up ***   Previous HPI 01/23/23 Thomas Schaefer is a 42 y.o. male here for follow up for seronegative rheumatoid arthritis on leflunomide 20 mg daily.  Since last visit he had right knee arthroscopic surgery with some debridement of extensive synovial hypertrophy and some loose cartilage bodies.  Afterwards he discontinued prednisone completely and after about 4 days started feeling generalized worsening of joint pain and stiffness and severe fatigue.  Initially tried managing symptoms with ibuprofen 800 mg after calling us I recommended resuming a lower dose prednisone and gradually tapering it off at 2-week increment.  So he is back to taking 5 mg twice daily right now and ibuprofen 800 mg every 6 hours and has been out of work for the past 2 weeks.  He is not having trouble taking the leflunomide but has not seen any improvement yet.  His right knee actually feels well and everywhere else is more painful and inflamed.  Has bilateral hand swelling.  Notices weakness inability to grip and lift strongly with his arms.  Also bilateral shoulder pain especially with overhead abduction and lying on his side in bed.   3/11 Right knee surgical pathology Chronically inflamed synovium with degenerative changes   Previous HPI 12/31/22 Thomas Schaefer is a 42 y.o. male here for follow up for RA and gout currently on prednisone 10 mg daily.  Right now he is in a symptom exacerbation doing much worse had a flareup after an exercise training session last week.  He is having pain in his right knee which has been the main ongoing issue also having more trouble with his shoulders right now  with pain worse posteriorly especially feels increased pain when lying on his side in bed.  He has had some continued weight gain with the long-term prednisone use starting phentermine for weight loss has not seen any progress so far.  He is scheduled for right knee surgery coming up in 1 week.   Previous HPI 08/27/22 Thomas Schaefer is a 42 y.o. male here for follow up for RA and gout currently on prednisone 10 mg daily. He stopped taking methotrexate due to running out of prescribed tablets after about 1 month. He saw [facility] for follow up with repeat aspiration and injection in right knee and symptoms stayed better for a month or more before swelling and pain got worse again. No new flare ups anywhere else. He discussed possibility of arthroscopy early next year due to the chronic symptoms.   Previous HPI 06/04/22 Thomas Schaefer is a 42 y.o. male here for follow up joint inflammation. Since our last visit he saw Dr. Jena Gauss and MRI of right knee obtained showing changes of synovitis and probable ruptured baker's cyst. He has had a steroid injection of the left knee and the left leg is doing well. Hand pain and stiffness are also doing very well mostly just has the knee pain. He started methotrexate since 2 weeks ago tolerating it okay. He decreased prednisone down to 10  mg once daily from twice for a week and is tolerating this okay his knee swelling increased some but not very painful.   Previous HPI 03/30/2022 Thomas Schaefer is a 42 y.o. male here for follow up for joint inflammation. Since our last visit he completed the prednisone his pain was partially improved on this but quickly returned off the medication. Swelling never resolved. He now has increased symptoms involving his hands with decreased grip strength and mobility. He notices increasing pain in left knee and right ankle he thinks is compensating for his worse affected joints.   Previous HPI 03/14/2022 Thomas Schaefer is a  42 y.o. male here for follow up for joint inflammation.  Shortly after completing most recent steroids he has redeveloped increased swelling of the right knee and swelling and pain in the left ankle.  He noticed the most recent steroid treatment improved his swelling but the left ankle pain was not dramatically improved. So far he has not seen an obvious difference in swelling while taking the sulfasalazine.   Previous HPI 02/27/2022 Thomas Schaefer is a 42 y.o. male here for follow up for joint inflammation. His knee improved greatly after aspiration and with prednisone taper but since 4 days ago left ankle pain and inflammation started. Pain is worst first thing in the morning and when standing up gets partially better when up and walking around. He is not taking anything for this currently    Previous HPI 02/07/22 Thomas Schaefer is a 42 y.o. male here for follow up for joint inflammation previously seen in October initial plan to treat gouty arthritis not clear if also having RA with a positive RF. He has suffered multiple and persistent joint inflammation treated with colchicine and multiple prednisone courses. He started taking allopurinol last month and within days developed severe pain and stiffness in his right knee and some pain extending up to the right hip posteriorly. Pain is intermittent and stabbing, and he cannot fully extend his knee. Symptoms are not similar to anything that he had in the past. He took 2 12 day prednisone tapers these improved symptoms but came back again once down to about 20 mg daily dose. He stopped taking colchicine in accordance with directions when prescribed the prednisone.   Previous HPI 08/21/21 Thomas Schaefer is a 42 y.o. male here for foot pain and inflammation with elevated rheumatoid factor and uric acid.  Symptoms started since around July of this year with pain on the right foot worse at the bottom of the foot and the smaller for toes. Initial treatment  with colchicine had partial benefit in symptoms.  There is also considerable initial swelling.  He received a short course of oral steroids with more symptom improvement but this persisted. He saw Dr. Al Corpus in September treatment with local steroid infection and oral prednisone taper and blood tests were checked.  Inflammatory markers were normal but rheumatoid factor and uric acid were significantly elevated.  Xray of the foot has shown soft tissue edema without significant osseus abnormalities.  After the injection and additional medication symptoms again improved at this point he is having persistent mild daily symptoms with some pain worsened with pressure and weight on the ball of his foot without much active swelling. He has had some chronic pain and instability of the right knee for a much longer time without any identified problem, xrays have been normal.  He does not drink much alcohol regularly.  He used to drink sodas very heavily but has  already decreased this to regular but small amounts before any onset of the symptoms.   Labs reviewed 06/2021 ANA neg RF 125 Uric acid 11.1 ESR 6 CRP 3.0   No Rheumatology ROS completed.   PMFS History:  Patient Active Problem List   Diagnosis Date Noted   High risk medication use 12/31/2022   Dyslipidemia, goal LDL below 100 10/20/2022   Other fatigue 09/03/2022   Abnormal fasting glucose 09/03/2022   Polyarticular gout 03/30/2022   Seronegative rheumatoid arthritis (HCC) 01/15/2022   Body mass index (BMI) of 31.0-31.9 in adult 01/15/2022   BMI 40.0-44.9, adult (HCC) 12/03/2021   Hyperuricemia 08/21/2021   Obesity 07/13/2015   Mood disorder (HCC) 07/13/2015   ALLERGIC RHINITIS 06/06/2007   GERD 06/06/2007   IRRITABLE BOWEL SYNDROME 06/06/2007    Past Medical History:  Diagnosis Date   GERD (gastroesophageal reflux disease)    Hyperlipidemia    Inflammatory polyarthritis (HCC)     Family History  Problem Relation Age of Onset    Mental illness Mother    Drug abuse Mother    Diabetes Father    Alcohol abuse Father    Stroke Father    Alcohol abuse Maternal Uncle    Past Surgical History:  Procedure Laterality Date   KNEE SURGERY Right    TOE SURGERY Left    VASECTOMY     Social History   Social History Narrative   Not on file   Immunization History  Administered Date(s) Administered   Moderna Sars-Covid-2 Vaccination 11/04/2019, 12/03/2019     Objective: Vital Signs: There were no vitals taken for this visit.   Physical Exam   Musculoskeletal Exam: ***  CDAI Exam: CDAI Score: -- Patient Global: --; Provider Global: -- Swollen: --; Tender: -- Joint Exam 03/06/2023   No joint exam has been documented for this visit   There is currently no information documented on the homunculus. Go to the Rheumatology activity and complete the homunculus joint exam.  Investigation: No additional findings.  Imaging: No results found.  Recent Labs: Lab Results  Component Value Date   WBC 7.8 09/03/2022   HGB 14.7 09/03/2022   PLT 265 09/03/2022   NA 142 12/31/2022   K 4.5 12/31/2022   CL 104 12/31/2022   CO2 28 12/31/2022   GLUCOSE 85 12/31/2022   BUN 14 12/31/2022   CREATININE 0.99 12/31/2022   BILITOT 0.7 12/31/2022   ALKPHOS 79 09/03/2022   AST 18 12/31/2022   ALT 24 12/31/2022   PROT 7.2 12/31/2022   ALBUMIN 3.8 (L) 09/03/2022   CALCIUM 10.0 12/31/2022   GFRAA >60 06/24/2017    Speciality Comments: No specialty comments available.  Procedures:  No procedures performed Allergies: Bee venom   Assessment / Plan:     Visit Diagnoses: No diagnosis found.  ***  Orders: No orders of the defined types were placed in this encounter.  No orders of the defined types were placed in this encounter.    Follow-Up Instructions: No follow-ups on file.   Fuller Plan, MD  Note - This record has been created using [employer].  Chart creation errors have been sought, but may  not always  have been located. Such creation errors do not reflect on  the standard of medical care.

## 2023-03-06 ENCOUNTER — Encounter: Payer: Self-pay | Admitting: Internal Medicine

## 2023-03-06 ENCOUNTER — Ambulatory Visit: Payer: BC Managed Care – PPO | Attending: Internal Medicine | Admitting: Internal Medicine

## 2023-03-06 VITALS — BP 104/67 | HR 73 | Resp 15 | Ht 73.0 in | Wt 311.0 lb

## 2023-03-06 DIAGNOSIS — H9201 Otalgia, right ear: Secondary | ICD-10-CM

## 2023-03-06 DIAGNOSIS — M109 Gout, unspecified: Secondary | ICD-10-CM | POA: Diagnosis not present

## 2023-03-06 DIAGNOSIS — M0600 Rheumatoid arthritis without rheumatoid factor, unspecified site: Secondary | ICD-10-CM

## 2023-03-06 DIAGNOSIS — Z79899 Other long term (current) drug therapy: Secondary | ICD-10-CM

## 2023-03-06 DIAGNOSIS — M25461 Effusion, right knee: Secondary | ICD-10-CM

## 2023-03-06 DIAGNOSIS — J309 Allergic rhinitis, unspecified: Secondary | ICD-10-CM

## 2023-03-06 MED ORDER — PREDNISONE 5 MG PO TABS: 5.0000 mg | ORAL_TABLET | Freq: Two times a day (BID) | ORAL | 2 refills | Status: DC

## 2023-03-06 MED ORDER — AZITHROMYCIN 250 MG PO TABS: ORAL_TABLET | ORAL | 0 refills | Status: AC

## 2023-03-07 LAB — CBC WITH DIFFERENTIAL/PLATELET
Absolute Monocytes: 336 cells/uL (ref 200–950)
Basophils Absolute: 50 cells/uL (ref 0–200)
Basophils Relative: 0.9 %
Eosinophils Absolute: 190 cells/uL (ref 15–500)
Eosinophils Relative: 3.4 %
HCT: 43.2 % (ref 38.5–50.0)
Hemoglobin: 14.4 g/dL (ref 13.2–17.1)
Lymphs Abs: 1322 cells/uL (ref 850–3900)
MCH: 28.1 pg (ref 27.0–33.0)
MCHC: 33.3 g/dL (ref 32.0–36.0)
MCV: 84.4 fL (ref 80.0–100.0)
MPV: 10.3 fL (ref 7.5–12.5)
Monocytes Relative: 6 %
Neutro Abs: 3702 cells/uL (ref 1500–7800)
Neutrophils Relative %: 66.1 %
Platelets: 253 10*3/uL (ref 140–400)
RBC: 5.12 10*6/uL (ref 4.20–5.80)
RDW: 12.1 % (ref 11.0–15.0)
Total Lymphocyte: 23.6 %
WBC: 5.6 10*3/uL (ref 3.8–10.8)

## 2023-03-07 LAB — COMPLETE METABOLIC PANEL WITH GFR
AG Ratio: 1.3 (calc) (ref 1.0–2.5)
ALT: 21 U/L (ref 9–46)
AST: 17 U/L (ref 10–40)
Albumin: 3.7 g/dL (ref 3.6–5.1)
Alkaline phosphatase (APISO): 71 U/L (ref 36–130)
BUN: 15 mg/dL (ref 7–25)
CO2: 28 mmol/L (ref 20–32)
Calcium: 8.8 mg/dL (ref 8.6–10.3)
Chloride: 106 mmol/L (ref 98–110)
Creat: 0.97 mg/dL (ref 0.60–1.29)
Globulin: 2.8 g/dL (calc) (ref 1.9–3.7)
Glucose, Bld: 156 mg/dL — ABNORMAL HIGH (ref 65–99)
Potassium: 4 mmol/L (ref 3.5–5.3)
Sodium: 141 mmol/L (ref 135–146)
Total Bilirubin: 0.6 mg/dL (ref 0.2–1.2)
Total Protein: 6.5 g/dL (ref 6.1–8.1)
eGFR: 101 mL/min/{1.73_m2} (ref >=60)

## 2023-03-07 LAB — C-REACTIVE PROTEIN: CRP: 10.8 mg/L — ABNORMAL HIGH (ref <8.0)

## 2023-03-07 NOTE — Progress Notes (Signed)
Lab results look fine for continuing the Enbrel. His sedimentation rate was mildly elevated at 10.2 consistent with active inflammation. CRP can also be increased in the setting of infections. We can stay at the current prednisone dose for now. I sent a Rx for azithromycin.

## 2023-03-27 ENCOUNTER — Other Ambulatory Visit (HOSPITAL_COMMUNITY): Payer: Self-pay

## 2023-03-29 ENCOUNTER — Other Ambulatory Visit (HOSPITAL_COMMUNITY): Payer: Self-pay

## 2023-04-01 ENCOUNTER — Other Ambulatory Visit (HOSPITAL_COMMUNITY): Payer: Self-pay

## 2023-04-25 ENCOUNTER — Other Ambulatory Visit (HOSPITAL_COMMUNITY): Payer: Self-pay

## 2023-04-29 ENCOUNTER — Other Ambulatory Visit (HOSPITAL_COMMUNITY): Payer: Self-pay

## 2023-04-29 ENCOUNTER — Other Ambulatory Visit: Payer: Self-pay | Admitting: Internal Medicine

## 2023-04-29 DIAGNOSIS — Z79899 Other long term (current) drug therapy: Secondary | ICD-10-CM

## 2023-04-29 DIAGNOSIS — M0600 Rheumatoid arthritis without rheumatoid factor, unspecified site: Secondary | ICD-10-CM

## 2023-04-29 NOTE — Telephone Encounter (Signed)
Last Fill: 02/05/2023  Labs: 03/06/2023 Lab results look fine for continuing the Enbrel. His sedimentation rate was mildly elevated at 10.2 consistent with active inflammation.   Tb Skin Test: Negative on 12/03/22   Next Visit: 06/04/2023  Last Visit: 03/06/2023  [id number] rheumatoid arthritis   Current Dose per office note 03/06/2023: Enbrel 50 mg subcu weekly   Okay to refill Enbrel?

## 2023-04-30 ENCOUNTER — Other Ambulatory Visit: Payer: Self-pay

## 2023-04-30 ENCOUNTER — Other Ambulatory Visit (HOSPITAL_COMMUNITY): Payer: Self-pay

## 2023-04-30 MED ORDER — ENBREL MINI 50 MG/ML ~~LOC~~ SOCT
50.0000 mg | SUBCUTANEOUS | 0 refills | Status: DC
  Filled 2023-04-30: qty 4, 28d supply, fill #0
  Filled 2023-05-22: qty 4, 28d supply, fill #1

## 2023-05-03 ENCOUNTER — Encounter: Payer: Self-pay | Admitting: Internal Medicine

## 2023-05-03 ENCOUNTER — Other Ambulatory Visit (HOSPITAL_COMMUNITY): Payer: Self-pay

## 2023-05-03 DIAGNOSIS — M064 Inflammatory polyarthropathy: Secondary | ICD-10-CM

## 2023-05-03 DIAGNOSIS — R202 Paresthesia of skin: Secondary | ICD-10-CM

## 2023-05-03 NOTE — Telephone Encounter (Signed)
Patient is currently prescribed enbrel and prednisone. Please advise.

## 2023-05-07 MED ORDER — GABAPENTIN 300 MG PO CAPS
300.0000 mg | ORAL_CAPSULE | Freq: Every evening | ORAL | 0 refills | Status: DC | PRN
Start: 2023-05-07 — End: 2023-09-04

## 2023-05-07 NOTE — Telephone Encounter (Signed)
Patient called in regards to these symptoms he is experiencing. Patient is requesting a return call or mychart message. Thanks!

## 2023-05-07 NOTE — Telephone Encounter (Signed)
I spoke with [name] about symptoms he is having symptoms in upper and lower extremities but worse throughout both arms with mostly numbness usually has a painful tingling type sensation that he describes feel similar to when the limb is asleep or impinged.  Does feel like his neck and upper back muscles are rockhard and tight that may be contributing.  If symptoms get worse if he sits in any kind of high-back chair that applies pressure and when lying on his back.  Also getting symptoms going down the legs occurs with sitting or standing in a fixed position.  The symptoms are new just in the past few weeks.  Also accompanied by increased swelling in his hands and feet on both sides.  Did not have any other new medical events or changes to his medications. Symptoms sound neuropathic not sure if there is impingement either at the spine or from myofascial impingement.  Less likely peripheral neuropathy or demyelinating neuropathy as a consequence of drug reaction would be very rare.  Will recommend addition of low-dose gabapentin for probable neuropathic pain symptoms.  Also see if we can get him urgently a visit with neurology to take a look and whether nerve conduction study or other imaging test would be appropriate next step.

## 2023-05-22 ENCOUNTER — Other Ambulatory Visit (HOSPITAL_COMMUNITY): Payer: Self-pay

## 2023-05-28 ENCOUNTER — Other Ambulatory Visit: Payer: Self-pay

## 2023-05-28 ENCOUNTER — Ambulatory Visit: Payer: BC Managed Care – PPO | Admitting: Neurology

## 2023-05-29 NOTE — Progress Notes (Signed)
Office Visit Note  Patient: Thomas Schaefer             Date of Birth: 02-26-1981           MRN: [id number]             PCP: Sandre Kitty, MD Referring: Carlean Jews, NP Visit Date: 06/04/2023   Subjective:  Follow-up (Patient states he never saw the neurologist because he had just gotten off of Covid. Patient states he will reschedule with them. )   History of Present Illness: Thomas Schaefer is a 42 y.o. male here for follow up for seronegative RA on Enbrel 50 mg subcu weekly and prednisone 5 mg twice daily.  He has tried decreasing prednisone to only once daily on some days but when stopping completely he quickly saw increase in joint pain and swelling within days.  He has a stable amount of persistent right knee swelling.  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  After the blistering at the sting site drained swelling has mostly gone down except in his left index finger.  We spoke on the phone last month due to increasing trouble with pain in his shoulders with some numbness tingling and sometimes painful sensation radiating down the arms.  Especially has trouble with shoulder pain at night if he lies on either side which frequently disrupts his sleep.  Recommended neurology evaluation for nerve conduction study but he missed this appointment due to being sick with COVID and needs to reschedule.  Previous HPI 03/06/2023 Thomas Schaefer is a 42 y.o. male here for follow up for seronegative RA on Enbrel now for 1 month and on prednisone 5 mg twice daily.  He is not noticing trouble with the Enbrel but also has not seen a significant improvement in joint symptoms.  Symptoms are mostly controlled with 5 mg twice daily prednisone still having swelling in his hands and wrists and bilateral shoulder pain.  Shoulder pain is most bothersome at nighttime and  frequently awakens him from sleep requiring repositioning.  Also gets pain during the day most with overhead movements.  When decreasing the prednisone further he notices some right-sided pain at the lower jaw and ear.  He has had pain in this area in multiple occasions on the past without specific cause identified that usually has resolved very quickly with antibiotic treatment.   Previous HPI 01/23/23 Thomas Schaefer is a 42 y.o. male here for follow up for seronegative rheumatoid arthritis on leflunomide 20 mg daily.  Since last visit he had right knee arthroscopic surgery with some debridement of extensive synovial hypertrophy and some loose cartilage bodies.  Afterwards he discontinued prednisone completely and after about 4 days started feeling generalized worsening of joint pain and stiffness and severe fatigue.  Initially tried managing symptoms with ibuprofen 800 mg after calling us I recommended resuming a lower dose prednisone and gradually tapering it off at 2-week increment.  So he is back to taking 5 mg twice daily right now and ibuprofen 800 mg every 6 hours and has been out of work for the past 2 weeks.  He is not having trouble taking the leflunomide but has not seen any improvement yet.  His right knee actually feels well and everywhere else is more painful and inflamed.  Has bilateral hand swelling.  Notices weakness inability to grip and lift  strongly with his arms.  Also bilateral shoulder pain especially with overhead abduction and lying on his side in bed.   3/11 Right knee surgical pathology Chronically inflamed synovium with degenerative changes   Previous HPI 12/31/22 Thomas Schaefer is a 42 y.o. male here for follow up for RA and gout currently on prednisone 10 mg daily.  Right now he is in a symptom exacerbation doing much worse had a flareup after an exercise training session last week.  He is having pain in his right knee which has been the main ongoing issue also having more  trouble with his shoulders right now with pain worse posteriorly especially feels increased pain when lying on his side in bed.  He has had some continued weight gain with the long-term prednisone use starting phentermine for weight loss has not seen any progress so far.  He is scheduled for right knee surgery coming up in 1 week.   Previous HPI 08/27/22 Thomas Schaefer is a 42 y.o. male here for follow up for RA and gout currently on prednisone 10 mg daily. He stopped taking methotrexate due to running out of prescribed tablets after about 1 month. He saw [facility] for follow up with repeat aspiration and injection in right knee and symptoms stayed better for a month or more before swelling and pain got worse again. No new flare ups anywhere else. He discussed possibility of arthroscopy early next year due to the chronic symptoms.   Previous HPI 06/04/22 Thomas Schaefer is a 42 y.o. male here for follow up joint inflammation. Since our last visit he saw Dr. Jena Gauss and MRI of right knee obtained showing changes of synovitis and probable ruptured baker's cyst. He has had a steroid injection of the left knee and the left leg is doing well. Hand pain and stiffness are also doing very well mostly just has the knee pain. He started methotrexate since 2 weeks ago tolerating it okay. He decreased prednisone down to 10 mg once daily from twice for a week and is tolerating this okay his knee swelling increased some but not very painful.   Previous HPI 03/30/2022 Thomas Schaefer is a 42 y.o. male here for follow up for joint inflammation. Since our last visit he completed the prednisone his pain was partially improved on this but quickly returned off the medication. Swelling never resolved. He now has increased symptoms involving his hands with decreased grip strength and mobility. He notices increasing pain in left knee and right ankle he thinks is compensating for his worse affected joints.   Previous  HPI 03/14/2022 Thomas Schaefer is a 42 y.o. male here for follow up for joint inflammation.  Shortly after completing most recent steroids he has redeveloped increased swelling of the right knee and swelling and pain in the left ankle.  He noticed the most recent steroid treatment improved his swelling but the left ankle pain was not dramatically improved. So far he has not seen an obvious difference in swelling while taking the sulfasalazine.   Previous HPI 02/27/2022 Thadeus Gandolfi is a 42 y.o. male here for follow up for joint inflammation. His knee improved greatly after aspiration and with prednisone taper but since 4 days ago left ankle pain and inflammation started. Pain is worst first thing in the morning and when standing up gets partially better when up and walking around. He is not taking anything for this currently    Previous HPI 02/07/22 Derin Granquist is a 42 y.o. male here for  follow up for joint inflammation previously seen in October initial plan to treat gouty arthritis not clear if also having RA with a positive RF. He has suffered multiple and persistent joint inflammation treated with colchicine and multiple prednisone courses. He started taking allopurinol last month and within days developed severe pain and stiffness in his right knee and some pain extending up to the right hip posteriorly. Pain is intermittent and stabbing, and he cannot fully extend his knee. Symptoms are not similar to anything that he had in the past. He took 2 12 day prednisone tapers these improved symptoms but came back again once down to about 20 mg daily dose. He stopped taking colchicine in accordance with directions when prescribed the prednisone.   Previous HPI 08/21/21 Reynold Mantell is a 42 y.o. male here for foot pain and inflammation with elevated rheumatoid factor and uric acid.  Symptoms started since around July of this year with pain on the right foot worse at the bottom of the foot and the  smaller for toes. Initial treatment with colchicine had partial benefit in symptoms.  There is also considerable initial swelling.  He received a short course of oral steroids with more symptom improvement but this persisted. He saw Dr. Al Corpus in September treatment with local steroid infection and oral prednisone taper and blood tests were checked.  Inflammatory markers were normal but rheumatoid factor and uric acid were significantly elevated.  Xray of the foot has shown soft tissue edema without significant osseus abnormalities.  After the injection and additional medication symptoms again improved at this point he is having persistent mild daily symptoms with some pain worsened with pressure and weight on the ball of his foot without much active swelling. He has had some chronic pain and instability of the right knee for a much longer time without any identified problem, xrays have been normal.  He does not drink much alcohol regularly.  He used to drink sodas very heavily but has already decreased this to regular but small amounts before any onset of the symptoms.   Labs reviewed 06/2021 ANA neg RF 125 Uric acid 11.1 ESR 6 CRP 3.0   Review of Systems  Constitutional:  Negative for fatigue.  HENT:  Negative for mouth sores and mouth dryness.   Eyes:  Negative for dryness.  Respiratory:  Negative for shortness of breath.   Cardiovascular:  Negative for chest pain and palpitations.  Gastrointestinal:  Negative for blood in stool, constipation and diarrhea.  Endocrine: Negative for increased urination.  Genitourinary:  Negative for involuntary urination.  Musculoskeletal:  Positive for joint pain, joint pain, joint swelling, myalgias, muscle weakness, muscle tenderness and myalgias. Negative for gait problem and morning stiffness.  Skin:  Negative for color change, rash, hair loss and sensitivity to sunlight.  Allergic/Immunologic: Negative for susceptible to infections.  Neurological:   Negative for dizziness and headaches.  Hematological:  Negative for swollen glands.  Psychiatric/Behavioral:  Positive for sleep disturbance. Negative for depressed mood. The patient is nervous/anxious.     PMFS History:  Patient Active Problem List   Diagnosis Date Noted   Impingement of shoulder 06/04/2023   Right ear pain 03/06/2023   High risk medication use 12/31/2022   Dyslipidemia, goal LDL below 100 10/20/2022   Other fatigue 09/03/2022   Abnormal fasting glucose 09/03/2022   Polyarticular gout 03/30/2022   Seronegative rheumatoid arthritis (HCC) 01/15/2022   Body mass index (BMI) of 31.0-31.9 in adult 01/15/2022   BMI 40.0-44.9, adult (HCC)  12/03/2021   Hyperuricemia 08/21/2021   Obesity 07/13/2015   Mood disorder (HCC) 07/13/2015   Allergic rhinitis 06/06/2007   GERD 06/06/2007   IRRITABLE BOWEL SYNDROME 06/06/2007    Past Medical History:  Diagnosis Date   GERD (gastroesophageal reflux disease)    Hyperlipidemia    Inflammatory polyarthritis (HCC)     Family History  Problem Relation Age of Onset   Mental illness Mother    Drug abuse Mother    Diabetes Father    Alcohol abuse Father    Stroke Father    Alcohol abuse Maternal Uncle    Past Surgical History:  Procedure Laterality Date   KNEE SURGERY Right    TOE SURGERY Left    VASECTOMY     Social History   Social History Narrative   Not on file   Immunization History  Administered Date(s) Administered   Moderna Sars-Covid-2 Vaccination 11/04/2019, 12/03/2019     Objective: Vital Signs: BP 125/78 (BP Location: Left Arm, Patient Position: Sitting, Cuff Size: Normal)   Pulse 81   Resp 14   Ht 6\' 1"  (1.854 m)   Wt 300 lb (136.1 kg)   BMI 39.58 kg/m    Physical Exam Constitutional:      Appearance: He is obese.  Eyes:     Conjunctiva/sclera: Conjunctivae normal.  Cardiovascular:     Rate and Rhythm: Normal rate and regular rhythm.  Pulmonary:     Effort: Pulmonary effort is normal.      Breath sounds: Normal breath sounds.  Musculoskeletal:     Comments: Trace pedal edema b/l  Lymphadenopathy:     Cervical: No cervical adenopathy.  Skin:    General: Skin is warm and dry.     Findings: No rash.  Neurological:     Mental Status: He is alert.  Psychiatric:        Mood and Affect: Mood normal.      Musculoskeletal Exam:  Shoulders full ROM right shoulder painful when externally rotated while at horizontal position Elbows full ROM no tenderness or swelling Left wrist swelling and tenderness to pressure and with range of motion, right wrist is nontender but pain provoked with flexion Swelling throughout the entire length of left index finger from MCP to distally with tenderness and decreased flexion range of motion, other fingers with generalized stiffness but no palpable synovitis Right knee mild persistent swelling chronic soft tissue thickening anteriorly, full range of motion   CDAI Exam: CDAI Score: 14  Patient Global: 40 / 100; Provider Global: 20 / 100 Swollen: 4 ; Tender: 4  Joint Exam 06/04/2023      Right  Left  Wrist   Tender  Swollen Tender  MCP 2     Swollen Tender  PIP 2 (finger)     Swollen Tender  Knee  Swollen         Investigation: No additional findings.  Imaging: No results found.  Recent Labs: Lab Results  Component Value Date   WBC 5.6 03/06/2023   HGB 14.4 03/06/2023   PLT 253 03/06/2023   NA 141 03/06/2023   K 4.0 03/06/2023   CL 106 03/06/2023   CO2 28 03/06/2023   GLUCOSE 156 (H) 03/06/2023   BUN 15 03/06/2023   CREATININE 0.97 03/06/2023   BILITOT 0.6 03/06/2023   ALKPHOS 79 09/03/2022   AST 17 03/06/2023   ALT 21 03/06/2023   PROT 6.5 03/06/2023   ALBUMIN 3.8 (L) 09/03/2022   CALCIUM 8.8 03/06/2023  GFRAA >60 06/24/2017    Speciality Comments: No specialty comments available.  Procedures:  No procedures performed Allergies: Bee venom   Assessment / Plan:     Visit Diagnoses: Seronegative rheumatoid  arthritis (HCC) - Plan: C-reactive protein, Uric acid  Has some palpable synovitis on exam but also just symptoms incompletely controlled and unable to wean off prednisone without disease flare.  CRP was elevated at last visit we will repeat see if this improves further with more time on Enbrel.  Can continue on the prednisone taking 5 mg once or twice daily.  Due to limited response recommending switch to Rinvoq 15 mg p.o. daily instead of Enbrel with which he is agreeable.  High risk medication use - Enbrel 50 mg subcu weekly. - Plan: Lipid panel, CBC with Differential/Platelet, COMPLETE METABOLIC PANEL WITH GFR  He was sick with COVID in the interval but no serious complications and recovered in normal timely fashion.  No other serious infections or antibiotics needed.  Recheck CBC and CMP for medication monitoring with Enbrel.  However also discussed switch to possibly Rinvoq due to limited response and checking lipid panel as well as other baseline labs.  Reviewed risk of medication including infections, cytopenias, hepatotoxicity, blood clots, or major cardiovascular events.  He has no personal history of diverticulitis blood clots or cardiovascular disease.  Hyperuricemia  Previously with persistent hyperuricemia though no discrete gout flares.  In the setting of incomplete control with seronegative RA will recheck if this remains very high above goal he might benefit with adding on urate lowering treatment possible mixed picture or 2 active processes.  Impingement of shoulder  Discussed shoulder pain I think there could be nerve impingement but also may be getting some rotator cuff and tendon shoulder impingement.  Provided at home range of motion and resistance exercises.  Also recommended him to reschedule follow-up with neurology.  Orders: Orders Placed This Encounter  Procedures   Lipid panel   CBC with Differential/Platelet   COMPLETE METABOLIC PANEL WITH GFR   C-reactive protein    Uric acid   No orders of the defined types were placed in this encounter.    Follow-Up Instructions: Return in about 3 months (around 09/04/2023) for RA UPA start/GC f/u 3mos.   Fuller Plan, MD  Note - This record has been created using [employer].  Chart creation errors have been sought, but may not always  have been located. Such creation errors do not reflect on  the standard of medical care.

## 2023-06-04 ENCOUNTER — Encounter: Payer: Self-pay | Admitting: Internal Medicine

## 2023-06-04 ENCOUNTER — Ambulatory Visit: Payer: BC Managed Care – PPO | Attending: Internal Medicine | Admitting: Internal Medicine

## 2023-06-04 VITALS — BP 125/78 | HR 81 | Resp 14 | Ht 73.0 in | Wt 300.0 lb

## 2023-06-04 DIAGNOSIS — H9201 Otalgia, right ear: Secondary | ICD-10-CM

## 2023-06-04 DIAGNOSIS — Z79899 Other long term (current) drug therapy: Secondary | ICD-10-CM | POA: Diagnosis not present

## 2023-06-04 DIAGNOSIS — M0600 Rheumatoid arthritis without rheumatoid factor, unspecified site: Secondary | ICD-10-CM | POA: Diagnosis not present

## 2023-06-04 DIAGNOSIS — Z7952 Long term (current) use of systemic steroids: Secondary | ICD-10-CM | POA: Diagnosis not present

## 2023-06-04 DIAGNOSIS — M25819 Other specified joint disorders, unspecified shoulder: Secondary | ICD-10-CM

## 2023-06-04 DIAGNOSIS — E790 Hyperuricemia without signs of inflammatory arthritis and tophaceous disease: Secondary | ICD-10-CM

## 2023-06-04 LAB — CBC WITH DIFFERENTIAL/PLATELET
Absolute Monocytes: 624 cells/uL (ref 200–950)
Basophils Absolute: 39 cells/uL (ref 0–200)
Basophils Relative: 0.5 %
Eosinophils Absolute: 223 cells/uL (ref 15–500)
Eosinophils Relative: 2.9 %
HCT: 41.9 % (ref 38.5–50.0)
Hemoglobin: 13.9 g/dL (ref 13.2–17.1)
Lymphs Abs: 1532 cells/uL (ref 850–3900)
MCH: 27.9 pg (ref 27.0–33.0)
MCHC: 33.2 g/dL (ref 32.0–36.0)
MCV: 84 fL (ref 80.0–100.0)
MPV: 10.3 fL (ref 7.5–12.5)
Monocytes Relative: 8.1 %
Neutro Abs: 5282 cells/uL (ref 1500–7800)
Neutrophils Relative %: 68.6 %
Platelets: 305 10*3/uL (ref 140–400)
RBC: 4.99 10*6/uL (ref 4.20–5.80)
RDW: 13 % (ref 11.0–15.0)
Total Lymphocyte: 19.9 %
WBC: 7.7 10*3/uL (ref 3.8–10.8)

## 2023-06-05 ENCOUNTER — Telehealth: Payer: Self-pay | Admitting: Pharmacist

## 2023-06-05 DIAGNOSIS — Z79899 Other long term (current) drug therapy: Secondary | ICD-10-CM

## 2023-06-05 DIAGNOSIS — M0600 Rheumatoid arthritis without rheumatoid factor, unspecified site: Secondary | ICD-10-CM

## 2023-06-05 NOTE — Telephone Encounter (Addendum)
Submitted a Prior Authorization request to [facility] for RINVOQ via CoverMyMeds.  Key: [id number]  Chesley Mires, PharmD, MPH, BCPS, CPP Clinical Pharmacist (Rheumatology and Pulmonology)

## 2023-06-05 NOTE — Progress Notes (Signed)
His CRP remains mildly elevated at 8.6 this is a little bit better than 10.8 3 months ago.  Uric acid is slightly above normal at 8.3 but I think the current symptoms do not suggest active gout.  His blood count and metabolic panel are entirely normal.  Total cholesterol of 153 is normal and better than where he was at years ago.  But his HDL is 35 which is a little bit low this might benefit with increasing aerobic exercise or could supplement such as omega-3 oils.  This is not bad enough to be a concern for starting Rinvoq.

## 2023-06-10 ENCOUNTER — Other Ambulatory Visit (HOSPITAL_COMMUNITY): Payer: Self-pay

## 2023-06-10 ENCOUNTER — Other Ambulatory Visit: Payer: Self-pay

## 2023-06-10 MED ORDER — RINVOQ 15 MG PO TB24
15.0000 mg | ORAL_TABLET | Freq: Every day | ORAL | 0 refills | Status: DC
Start: 2023-06-10 — End: 2023-09-04
  Filled 2023-06-10: qty 90, 90d supply, fill #0
  Filled 2023-06-12: qty 30, 30d supply, fill #0
  Filled 2023-07-15: qty 30, 30d supply, fill #1
  Filled 2023-08-14: qty 30, 30d supply, fill #2

## 2023-06-10 NOTE — Telephone Encounter (Signed)
Received notification from [facility] regarding a prior authorization for [name]. Authorization has been APPROVED from 06/05/2023 to 06/04/2024. Approval letter sent to scan center.  Per test claim, copay for 28 days supply is $100  Patient can fill through [facility]: [phone]   Authorization # [id number] Phone # [phone]  Enrolled patient into Rinvoq copay card: ID: [id number] Issued:06/10/2023 Rx GROUP: [id number] Rx BIN: [id number] Rx PCN: [id number]  ATC patient to discuss Rinvoq approval. Unableto reach. Left VM advising that Thomas Schaefer will be reaching out to schedule shipment to his home. MyChart message also sent  Chesley Mires, PharmD, MPH, BCPS, CPP Clinical Pharmacist (Rheumatology and Pulmonology)

## 2023-06-12 ENCOUNTER — Other Ambulatory Visit (HOSPITAL_COMMUNITY): Payer: Self-pay

## 2023-06-12 ENCOUNTER — Other Ambulatory Visit: Payer: Self-pay

## 2023-06-12 NOTE — Telephone Encounter (Signed)
Attempted to contact pt to schedule med shipment, left VoiceMail requesting a return call. Direct office number provided.

## 2023-06-12 NOTE — Telephone Encounter (Signed)
Delivery instructions have been updated in [facility], medication will be shipped to patient's home address on 06/25/23.  Rx has been processed in [facility] and copay card/PAP info has been provided to the pharmacy. The patient has no copay at this time.

## 2023-06-14 ENCOUNTER — Other Ambulatory Visit: Payer: Self-pay | Admitting: Internal Medicine

## 2023-06-14 DIAGNOSIS — M25461 Effusion, right knee: Secondary | ICD-10-CM

## 2023-06-14 NOTE — Telephone Encounter (Signed)
Last Fill: 03/06/2023  Next Visit: 09/04/2023  Last Visit: 06/04/2023  Dx: Seronegative rheumatoid arthritis   Current Dose per office note on 06/04/2023: prednisone taking 5 mg once or twice daily   Okay to refill Prednisone?

## 2023-06-24 ENCOUNTER — Other Ambulatory Visit (HOSPITAL_COMMUNITY): Payer: Self-pay

## 2023-07-11 ENCOUNTER — Other Ambulatory Visit (HOSPITAL_COMMUNITY): Payer: Self-pay

## 2023-07-15 ENCOUNTER — Other Ambulatory Visit (HOSPITAL_COMMUNITY): Payer: Self-pay

## 2023-08-14 ENCOUNTER — Other Ambulatory Visit: Payer: Self-pay

## 2023-08-14 NOTE — Progress Notes (Signed)
Specialty Pharmacy Refill Coordination Note  Thomas Schaefer is a 42 y.o. male contacted today regarding refills of specialty medication(s) Upadacitinib   Patient requested Delivery   Delivery date: 08/20/23   Verified address: [address]   [place] [phone]   Medication will be filled on 08/19/23.

## 2023-08-21 NOTE — Progress Notes (Signed)
Office Visit Note  Patient: Thomas Schaefer             Date of Birth: 17-Mar-1981           MRN: [id number]             PCP: Sandre Kitty, MD Referring: Sandre Kitty, MD Visit Date: 09/04/2023   Subjective:  Follow-up (Patient states when he left lifts his arms around his shoulders hurt. Patient states when he is driving, walking up or down stairs, he gets pain around his legs in the groin area. Patient states his ankles hurt all the time. Patient states his right knee still has fluid on it but it does not hurt as bad. )   History of Present Illness: Thomas Schaefer is a 42 y.o. male here for follow up for seronegative RA on Rinvoq 15 mg p.o. daily and prednisone 5 mg twice daily.  Since witching to Rinvoq he is overall mostly doing better.  Most of his joint pain and stiffness outside of the right knee is controlled with just the Rinvoq.  He does notice symptoms creeping in a bit worse by the end of the day.  He decreased the prednisone now taking just 5 mg once daily if he stops this for 2 or 3 days he redeveloped significant knee swelling.  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  Previous HPI 06/04/2023 Thomas Schaefer is a 42 y.o. male here for follow up for seronegative RA on Enbrel 50 mg subcu weekly and prednisone 5 mg twice daily.  He has tried decreasing prednisone to only once daily on some days but when stopping completely he quickly saw increase in joint pain and swelling within days.  He has a stable amount of persistent right knee swelling.  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  After the blistering at the sting site drained swelling has mostly gone down except in his  left index finger.  We spoke on the phone last month due to increasing trouble with pain in his shoulders with some numbness tingling and sometimes painful sensation radiating down the arms.  Especially has trouble with shoulder pain at night if he lies on either side which frequently disrupts his sleep.  Recommended neurology evaluation for nerve conduction study but he missed this appointment due to being sick with COVID and needs to reschedule.   Previous HPI 03/06/2023 Thomas Schaefer is a 42 y.o. male here for follow up for seronegative RA on Enbrel now for 1 month and on prednisone 5 mg twice daily.  He is not noticing trouble with the Enbrel but also has not seen a significant improvement in joint symptoms.  Symptoms are mostly controlled with 5 mg twice daily prednisone still having swelling in his hands and wrists and bilateral shoulder pain.  Shoulder pain is most bothersome at nighttime and frequently awakens him from sleep requiring repositioning.  Also gets pain during the day most with overhead movements.  When decreasing the prednisone further he notices some right-sided pain at the lower jaw and ear.  He has had pain in this area in multiple occasions on the past without specific cause identified that  usually has resolved very quickly with antibiotic treatment.   Previous HPI 01/23/23 Thomas Schaefer is a 42 y.o. male here for follow up for seronegative rheumatoid arthritis on leflunomide 20 mg daily.  Since last visit he had right knee arthroscopic surgery with some debridement of extensive synovial hypertrophy and some loose cartilage bodies.  Afterwards he discontinued prednisone completely and after about 4 days started feeling generalized worsening of joint pain and stiffness and severe fatigue.  Initially tried managing symptoms with ibuprofen 800 mg after calling us I recommended resuming a lower dose prednisone and gradually tapering it off at 2-week increment.  So he is back to taking  5 mg twice daily right now and ibuprofen 800 mg every 6 hours and has been out of work for the past 2 weeks.  He is not having trouble taking the leflunomide but has not seen any improvement yet.  His right knee actually feels well and everywhere else is more painful and inflamed.  Has bilateral hand swelling.  Notices weakness inability to grip and lift strongly with his arms.  Also bilateral shoulder pain especially with overhead abduction and lying on his side in bed.   3/11 Right knee surgical pathology Chronically inflamed synovium with degenerative changes   Previous HPI 12/31/22 Thomas Schaefer is a 42 y.o. male here for follow up for RA and gout currently on prednisone 10 mg daily.  Right now he is in a symptom exacerbation doing much worse had a flareup after an exercise training session last week.  He is having pain in his right knee which has been the main ongoing issue also having more trouble with his shoulders right now with pain worse posteriorly especially feels increased pain when lying on his side in bed.  He has had some continued weight gain with the long-term prednisone use starting phentermine for weight loss has not seen any progress so far.  He is scheduled for right knee surgery coming up in 1 week.   Previous HPI 08/27/22 Thomas Schaefer is a 42 y.o. male here for follow up for RA and gout currently on prednisone 10 mg daily. He stopped taking methotrexate due to running out of prescribed tablets after about 1 month. He saw [facility] for follow up with repeat aspiration and injection in right knee and symptoms stayed better for a month or more before swelling and pain got worse again. No new flare ups anywhere else. He discussed possibility of arthroscopy early next year due to the chronic symptoms.   Previous HPI 06/04/22 Thomas Schaefer is a 42 y.o. male here for follow up joint inflammation. Since our last visit he saw Dr. Jena Gauss and MRI of right knee obtained  showing changes of synovitis and probable ruptured baker's cyst. He has had a steroid injection of the left knee and the left leg is doing well. Hand pain and stiffness are also doing very well mostly just has the knee pain. He started methotrexate since 2 weeks ago tolerating it okay. He decreased prednisone down to 10 mg once daily from twice for a week and is tolerating this okay his knee swelling increased some but not very painful.   Previous HPI 03/30/2022 Thomas Schaefer is a 42 y.o. male here for follow up for joint inflammation. Since our last visit he completed the prednisone his pain was partially improved on this but quickly returned off the medication. Swelling never resolved. He now has increased symptoms involving his hands with decreased grip strength and mobility.  He notices increasing pain in left knee and right ankle he thinks is compensating for his worse affected joints.   Previous HPI 03/14/2022 Thomas Schaefer is a 42 y.o. male here for follow up for joint inflammation.  Shortly after completing most recent steroids he has redeveloped increased swelling of the right knee and swelling and pain in the left ankle.  He noticed the most recent steroid treatment improved his swelling but the left ankle pain was not dramatically improved. So far he has not seen an obvious difference in swelling while taking the sulfasalazine.   Previous HPI 02/27/2022 Thomas Schaefer is a 42 y.o. male here for follow up for joint inflammation. His knee improved greatly after aspiration and with prednisone taper but since 4 days ago left ankle pain and inflammation started. Pain is worst first thing in the morning and when standing up gets partially better when up and walking around. He is not taking anything for this currently    Previous HPI 02/07/22 Thomas Schaefer is a 42 y.o. male here for follow up for joint inflammation previously seen in October initial plan to treat gouty arthritis not clear if also  having RA with a positive RF. He has suffered multiple and persistent joint inflammation treated with colchicine and multiple prednisone courses. He started taking allopurinol last month and within days developed severe pain and stiffness in his right knee and some pain extending up to the right hip posteriorly. Pain is intermittent and stabbing, and he cannot fully extend his knee. Symptoms are not similar to anything that he had in the past. He took 2 12 day prednisone tapers these improved symptoms but came back again once down to about 20 mg daily dose. He stopped taking colchicine in accordance with directions when prescribed the prednisone.   Previous HPI 08/21/21 Thomas Schaefer is a 42 y.o. male here for foot pain and inflammation with elevated rheumatoid factor and uric acid.  Symptoms started since around July of this year with pain on the right foot worse at the bottom of the foot and the smaller for toes. Initial treatment with colchicine had partial benefit in symptoms.  There is also considerable initial swelling.  He received a short course of oral steroids with more symptom improvement but this persisted. He saw Dr. Al Corpus in September treatment with local steroid infection and oral prednisone taper and blood tests were checked.  Inflammatory markers were normal but rheumatoid factor and uric acid were significantly elevated.  Xray of the foot has shown soft tissue edema without significant osseus abnormalities.  After the injection and additional medication symptoms again improved at this point he is having persistent mild daily symptoms with some pain worsened with pressure and weight on the ball of his foot without much active swelling. He has had some chronic pain and instability of the right knee for a much longer time without any identified problem, xrays have been normal.  He does not drink much alcohol regularly.  He used to drink sodas very heavily but has already decreased this to  regular but small amounts before any onset of the symptoms.   Labs reviewed 06/2021 ANA neg RF 125 Uric acid 11.1 ESR 6 CRP 3.0   Review of Systems  Constitutional:  Positive for fatigue.  HENT:  Negative for mouth sores and mouth dryness.   Eyes:  Negative for dryness.  Respiratory:  Negative for shortness of breath.   Cardiovascular:  Negative for chest pain and palpitations.  Gastrointestinal:  Positive for constipation. Negative for blood in stool and diarrhea.  Endocrine: Negative for increased urination.  Genitourinary:  Negative for involuntary urination.  Musculoskeletal:  Positive for joint pain, joint pain, joint swelling, muscle weakness and muscle tenderness. Negative for gait problem, myalgias, morning stiffness and myalgias.  Skin:  Negative for color change, rash, hair loss and sensitivity to sunlight.  Allergic/Immunologic: Negative for susceptible to infections.  Neurological:  Negative for dizziness and headaches.  Hematological:  Positive for swollen glands.  Psychiatric/Behavioral:  Positive for depressed mood and sleep disturbance. The patient is nervous/anxious.     PMFS History:  Patient Active Problem List   Diagnosis Date Noted   Impingement of shoulder 06/04/2023   Right ear pain 03/06/2023   High risk medication use 12/31/2022   Dyslipidemia, goal LDL below 100 10/20/2022   Other fatigue 09/03/2022   Abnormal fasting glucose 09/03/2022   Polyarticular gout 03/30/2022   Seronegative rheumatoid arthritis (HCC) 01/15/2022   Body mass index (BMI) of 31.0-31.9 in adult 01/15/2022   BMI 40.0-44.9, adult (HCC) 12/03/2021   Hyperuricemia 08/21/2021   Obesity 07/13/2015   Mood disorder (HCC) 07/13/2015   Allergic rhinitis 06/06/2007   GERD 06/06/2007   IRRITABLE BOWEL SYNDROME 06/06/2007    Past Medical History:  Diagnosis Date   GERD (gastroesophageal reflux disease)    Hyperlipidemia    Inflammatory polyarthritis (HCC)     Family History   Problem Relation Age of Onset   Mental illness Mother    Drug abuse Mother    Diabetes Father    Alcohol abuse Father    Stroke Father    Alcohol abuse Maternal Uncle    Past Surgical History:  Procedure Laterality Date   KNEE SURGERY Right    TOE SURGERY Left    VASECTOMY     Social History   Social History Narrative   Not on file   Immunization History  Administered Date(s) Administered   Moderna Sars-Covid-2 Vaccination 11/04/2019, 12/03/2019     Objective: Vital Signs: BP 118/76 (BP Location: Left Arm, Patient Position: Sitting, Cuff Size: Large)   Pulse 89   Resp 14   Ht 6\' 1"  (1.854 m)   Wt 298 lb (135.2 kg)   BMI 39.32 kg/m    Physical Exam Constitutional:      Appearance: He is obese.  Eyes:     Conjunctiva/sclera: Conjunctivae normal.  Cardiovascular:     Rate and Rhythm: Normal rate and regular rhythm.  Pulmonary:     Effort: Pulmonary effort is normal.     Breath sounds: Normal breath sounds.  Musculoskeletal:     Right lower leg: No edema.     Left lower leg: No edema.  Lymphadenopathy:     Cervical: No cervical adenopathy.  Skin:    General: Skin is warm and dry.     Findings: No rash.  Neurological:     Mental Status: He is alert.  Psychiatric:        Mood and Affect: Mood normal.      Musculoskeletal Exam:  Shoulders full ROM right shoulder very painful raising overhead, tenderness on lateral and anterior surface, no swelling Elbows full ROM no tenderness or swelling Wrists full ROM no tenderness or swelling Fingers full ROM no tenderness or swelling Hip anterior pain provoked with internal rotation but ROM intact Knees full ROM, right knee small effusion Ankles full ROM no tenderness or swelling   Investigation: No additional findings.  Imaging: No results found.  Recent Labs:  Lab Results  Component Value Date   WBC 7.5 09/04/2023   HGB 15.3 09/04/2023   PLT 280 09/04/2023   NA 141 09/04/2023   K 3.8 09/04/2023   CL  104 09/04/2023   CO2 28 09/04/2023   GLUCOSE 61 (L) 09/04/2023   BUN 7 09/04/2023   CREATININE 1.11 09/04/2023   BILITOT 0.6 09/04/2023   ALKPHOS 79 09/03/2022   AST 28 09/04/2023   ALT 38 09/04/2023   PROT 6.9 09/04/2023   ALBUMIN 3.8 (L) 09/03/2022   CALCIUM 9.2 09/04/2023   GFRAA >60 06/24/2017    Speciality Comments: No specialty comments available.  Procedures:  No procedures performed Allergies: Bee venom   Assessment / Plan:     Visit Diagnoses: Seronegative rheumatoid arthritis (HCC) - Plan: Upadacitinib ER (RINVOQ) 15 MG TB24  So far Rinvoq works better than previous trials of Biologics.  Right knee remains somewhat refractory but also probably a degree of synovial hypertrophy from the prolonged inflammation.  Plan to continue the Rinvoq 15 mg daily and prednisone 5 mg once daily.  High risk medication use - Plan: Upadacitinib ER (RINVOQ) 15 MG TB24, CBC with Differential/Platelet, COMPLETE METABOLIC PANEL WITH GFR, Lipid panel  Checking CBC and CMP for medication monitoring after starting Rinvoq.  Also rechecking lipid panel.  Not having trouble with medicine and no serious interval infection.  Impingement of shoulder  Shoulder symptoms suggestive for impingement with the positional pain, symptom radiation, and provocative nighttime positions.  Discussed that stretching and range of motion exercise may help with resetting shoulder position or by addressing back pain and stiffness.  He plans to follow-up with a chiropractor for some manipulation which I think is definitely reasonable.  Effusion, right knee - Plan: predniSONE (DELTASONE) 5 MG tablet Long term (current) use of systemic steroids - prednisone taking 5 mg once or twice daily   Orders: Orders Placed This Encounter  Procedures   CBC with Differential/Platelet   COMPLETE METABOLIC PANEL WITH GFR   Lipid panel   Meds ordered this encounter  Medications   predniSONE (DELTASONE) 5 MG tablet    Sig: Take  1 tablet (5 mg total) by mouth daily with breakfast.    Dispense:  90 tablet    Refill:  0   Upadacitinib ER (RINVOQ) 15 MG TB24    Sig: Take 1 tablet (15 mg total) by mouth daily.    Dispense:  90 tablet    Refill:  0    Order Specific Question:   Prescription Type:    Answer:   Renewal     Follow-Up Instructions: Return in about 3 months (around 12/05/2023) for RA on UPA/GC f/u 3mos.   Fuller Plan, MD  Note - This record has been created using [employer].  Chart creation errors have been sought, but may not always  have been located. Such creation errors do not reflect on  the standard of medical care.

## 2023-09-04 ENCOUNTER — Ambulatory Visit: Payer: BC Managed Care – PPO | Attending: Internal Medicine | Admitting: Internal Medicine

## 2023-09-04 ENCOUNTER — Encounter: Payer: Self-pay | Admitting: Internal Medicine

## 2023-09-04 VITALS — BP 118/76 | HR 89 | Resp 14 | Ht 73.0 in | Wt 298.0 lb

## 2023-09-04 DIAGNOSIS — M25819 Other specified joint disorders, unspecified shoulder: Secondary | ICD-10-CM

## 2023-09-04 DIAGNOSIS — Z79899 Other long term (current) drug therapy: Secondary | ICD-10-CM | POA: Diagnosis not present

## 2023-09-04 DIAGNOSIS — M25461 Effusion, right knee: Secondary | ICD-10-CM

## 2023-09-04 DIAGNOSIS — Z7952 Long term (current) use of systemic steroids: Secondary | ICD-10-CM

## 2023-09-04 DIAGNOSIS — E790 Hyperuricemia without signs of inflammatory arthritis and tophaceous disease: Secondary | ICD-10-CM | POA: Diagnosis not present

## 2023-09-04 DIAGNOSIS — M0600 Rheumatoid arthritis without rheumatoid factor, unspecified site: Secondary | ICD-10-CM

## 2023-09-04 MED ORDER — RINVOQ 15 MG PO TB24
15.0000 mg | ORAL_TABLET | Freq: Every day | ORAL | 0 refills | Status: DC
Start: 2023-09-04 — End: 2023-12-10
  Filled 2023-09-05: qty 90, 90d supply, fill #0
  Filled 2023-09-10: qty 30, 30d supply, fill #0
  Filled 2023-10-07: qty 30, 30d supply, fill #1
  Filled 2023-11-11: qty 30, 30d supply, fill #2

## 2023-09-04 MED ORDER — PREDNISONE 5 MG PO TABS: 5.0000 mg | ORAL_TABLET | Freq: Every day | ORAL | 0 refills | Status: DC

## 2023-09-05 ENCOUNTER — Other Ambulatory Visit: Payer: Self-pay

## 2023-09-05 ENCOUNTER — Other Ambulatory Visit (HOSPITAL_COMMUNITY): Payer: Self-pay

## 2023-09-05 LAB — CBC WITH DIFFERENTIAL/PLATELET
Absolute Lymphocytes: 2108 {cells}/uL (ref 850–3900)
Absolute Monocytes: 593 {cells}/uL (ref 200–950)
Basophils Absolute: 53 {cells}/uL (ref 0–200)
Basophils Relative: 0.7 %
Eosinophils Absolute: 180 {cells}/uL (ref 15–500)
Eosinophils Relative: 2.4 %
HCT: 45.6 % (ref 38.5–50.0)
Hemoglobin: 15.3 g/dL (ref 13.2–17.1)
MCH: 28.3 pg (ref 27.0–33.0)
MCHC: 33.6 g/dL (ref 32.0–36.0)
MCV: 84.4 fL (ref 80.0–100.0)
MPV: 10.4 fL (ref 7.5–12.5)
Monocytes Relative: 7.9 %
Neutro Abs: 4568 {cells}/uL (ref 1500–7800)
Neutrophils Relative %: 60.9 %
Platelets: 280 10*3/uL (ref 140–400)
RBC: 5.4 10*6/uL (ref 4.20–5.80)
RDW: 13.5 % (ref 11.0–15.0)
Total Lymphocyte: 28.1 %
WBC: 7.5 10*3/uL (ref 3.8–10.8)

## 2023-09-05 LAB — COMPLETE METABOLIC PANEL WITH GFR
AG Ratio: 1.6 (calc) (ref 1.0–2.5)
ALT: 38 U/L (ref 9–46)
AST: 28 U/L (ref 10–40)
Albumin: 4.2 g/dL (ref 3.6–5.1)
Alkaline phosphatase (APISO): 68 U/L (ref 36–130)
BUN: 7 mg/dL (ref 7–25)
CO2: 28 mmol/L (ref 20–32)
Calcium: 9.2 mg/dL (ref 8.6–10.3)
Chloride: 104 mmol/L (ref 98–110)
Creat: 1.11 mg/dL (ref 0.60–1.29)
Globulin: 2.7 g/dL (ref 1.9–3.7)
Glucose, Bld: 61 mg/dL — ABNORMAL LOW (ref 65–99)
Potassium: 3.8 mmol/L (ref 3.5–5.3)
Sodium: 141 mmol/L (ref 135–146)
Total Bilirubin: 0.6 mg/dL (ref 0.2–1.2)
Total Protein: 6.9 g/dL (ref 6.1–8.1)
eGFR: 85 mL/min/{1.73_m2} (ref >=60)

## 2023-09-05 LAB — LIPID PANEL
Cholesterol: 169 mg/dL (ref <200)
HDL: 43 mg/dL (ref >=40)
LDL Cholesterol (Calc): 96 mg/dL
Non-HDL Cholesterol (Calc): 126 mg/dL (ref <130)
Total CHOL/HDL Ratio: 3.9 (calc) (ref <5.0)
Triglycerides: 198 mg/dL — ABNORMAL HIGH (ref <150)

## 2023-09-09 ENCOUNTER — Encounter (HOSPITAL_COMMUNITY): Payer: Self-pay

## 2023-09-09 ENCOUNTER — Other Ambulatory Visit (HOSPITAL_COMMUNITY): Payer: Self-pay

## 2023-09-10 ENCOUNTER — Other Ambulatory Visit (HOSPITAL_COMMUNITY): Payer: Self-pay | Admitting: Pharmacy Technician

## 2023-09-10 ENCOUNTER — Other Ambulatory Visit (HOSPITAL_COMMUNITY): Payer: Self-pay

## 2023-09-10 NOTE — Progress Notes (Signed)
Specialty Pharmacy Refill Coordination Note  Thomas Schaefer is a 42 y.o. male contacted today regarding refills of specialty medication(s) Upadacitinib   Patient requested Delivery   Delivery date: 09/17/23   Verified address: [address]  [name]   Medication will be filled on 09/16/23.

## 2023-09-11 ENCOUNTER — Other Ambulatory Visit: Payer: Self-pay

## 2023-10-07 ENCOUNTER — Other Ambulatory Visit (HOSPITAL_COMMUNITY): Payer: Self-pay | Admitting: Pharmacy Technician

## 2023-10-07 ENCOUNTER — Other Ambulatory Visit (HOSPITAL_COMMUNITY): Payer: Self-pay

## 2023-10-07 NOTE — Progress Notes (Signed)
Specialty Pharmacy Refill Coordination Note  Thomas Schaefer is a 42 y.o. male contacted today regarding refills of specialty medication(s) Upadacitinib   Patient requested Delivery   Delivery date: 10/17/23   Verified address: [address] [place] [id number]   Medication will be filled on 10/16/23.

## 2023-10-16 ENCOUNTER — Other Ambulatory Visit: Payer: Self-pay

## 2023-11-05 ENCOUNTER — Other Ambulatory Visit: Payer: Self-pay

## 2023-11-08 ENCOUNTER — Other Ambulatory Visit: Payer: Self-pay

## 2023-11-11 ENCOUNTER — Other Ambulatory Visit: Payer: Self-pay

## 2023-11-11 NOTE — Progress Notes (Signed)
 Specialty Pharmacy Refill Coordination Note  Thomas Schaefer is a 43 y.o. male contacted today regarding refills of specialty medication(s) Upadacitinib  (Rinvoq )   Patient requested Delivery   Delivery date: 11/19/23   Verified address: [address]   [name] [id number]   Medication will be filled on 11/18/23.

## 2023-11-15 ENCOUNTER — Other Ambulatory Visit: Payer: Self-pay

## 2023-11-15 NOTE — Progress Notes (Signed)
Patient was left a voice mail, due to [employer] closure on Monday 11/18/23, medication will be filled today 11/15/23.

## 2023-11-27 NOTE — Progress Notes (Signed)
 Office Visit Note  Patient: Thomas Schaefer             Date of Birth: 09-05-1981           MRN: [id number]             PCP: Sandre Kitty, MD Referring: Sandre Kitty, MD Visit Date: 12/10/2023   Subjective:  Follow-up   Discussed the use of AI scribe software for clinical note transcription with the patient, who gave verbal consent to proceed.  History of Present Illness   Thomas Schaefer is a 43 y.o. male here for follow up for seronegative RA on Rinvoq 15 mg p.o. daily.  He is managing rheumatoid arthritis with Rinvoq after discontinuing prednisone. Rinvoq has effectively reduced pain in his hand joints, allowing him to close his fist without pain, though his fingers remain swollen, preventing him from wearing his ring. 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 The pain subsides after the first 10 to 20 steps.  He experiences sleep disturbances due to shoulder pain and restless legs. He describes numbness and a restless sensation in his shoulders when lying down, necessitating frequent position changes. His restless leg symptoms have worsened, characterized by an uncomfortable sensation that compels movement for relief. He has not previously taken medication for restless legs and denies any history of iron deficiency.  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 The results were normal except for slightly elevated cholesterol levels. No recent illnesses have been reported.    Previous HPI 09/04/2023 Thomas Schaefer is a 43 y.o. male here for follow up for seronegative RA on Rinvoq 15 mg p.o. daily and prednisone 5 mg twice daily.  Since witching to Rinvoq he is overall mostly doing better.  Most of his joint pain and stiffness outside of the right knee  is controlled with just the Rinvoq.  He does notice symptoms creeping in a bit worse by the end of the day.  He decreased the prednisone now taking just 5 mg once daily if he stops this for 2 or 3 days he redeveloped significant knee swelling.  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   Previous HPI 06/04/2023 Thomas Schaefer is a 43 y.o. male here for follow up for seronegative RA on Enbrel 50 mg subcu weekly and prednisone 5 mg twice daily.  He has tried decreasing prednisone to only once daily on some days but when stopping completely he quickly saw increase in joint pain and swelling within days.  He has a stable amount of persistent right knee swelling.  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  After the blistering at the sting site drained swelling has mostly gone down except in his left index finger.  We spoke on the phone last month due to increasing trouble with pain in his shoulders with some numbness tingling and sometimes painful sensation radiating down the arms.  Especially has trouble with shoulder pain at night if he lies  on either side which frequently disrupts his sleep.  Recommended neurology evaluation for nerve conduction study but he missed this appointment due to being sick with COVID and needs to reschedule.   Previous HPI 03/06/2023 Thomas Schaefer is a 43 y.o. male here for follow up for seronegative RA on Enbrel now for 1 month and on prednisone 5 mg twice daily.  He is not noticing trouble with the Enbrel but also has not seen a significant improvement in joint symptoms.  Symptoms are mostly controlled with 5 mg twice daily prednisone still having swelling in his hands and wrists  and bilateral shoulder pain.  Shoulder pain is most bothersome at nighttime and frequently awakens him from sleep requiring repositioning.  Also gets pain during the day most with overhead movements.  When decreasing the prednisone further he notices some right-sided pain at the lower jaw and ear.  He has had pain in this area in multiple occasions on the past without specific cause identified that usually has resolved very quickly with antibiotic treatment.   Previous HPI 01/23/23 Thomas Schaefer is a 43 y.o. male here for follow up for seronegative rheumatoid arthritis on leflunomide 20 mg daily.  Since last visit he had right knee arthroscopic surgery with some debridement of extensive synovial hypertrophy and some loose cartilage bodies.  Afterwards he discontinued prednisone completely and after about 4 days started feeling generalized worsening of joint pain and stiffness and severe fatigue.  Initially tried managing symptoms with ibuprofen 800 mg after calling us I recommended resuming a lower dose prednisone and gradually tapering it off at 2-week increment.  So he is back to taking 5 mg twice daily right now and ibuprofen 800 mg every 6 hours and has been out of work for the past 2 weeks.  He is not having trouble taking the leflunomide but has not seen any improvement yet.  His right knee actually feels well and everywhere else is more painful and inflamed.  Has bilateral hand swelling.  Notices weakness inability to grip and lift strongly with his arms.  Also bilateral shoulder pain especially with overhead abduction and lying on his side in bed.   3/11 Right knee surgical pathology Chronically inflamed synovium with degenerative changes   Previous HPI 12/31/22 Thomas Schaefer is a 43 y.o. male here for follow up for RA and gout currently on prednisone 10 mg daily.  Right now he is in a symptom exacerbation doing much worse had a flareup after an exercise training session last week.  He is having  pain in his right knee which has been the main ongoing issue also having more trouble with his shoulders right now with pain worse posteriorly especially feels increased pain when lying on his side in bed.  He has had some continued weight gain with the long-term prednisone use starting phentermine for weight loss has not seen any progress so far.  He is scheduled for right knee surgery coming up in 1 week.   Previous HPI 08/27/22 Thomas Schaefer is a 43 y.o. male here for follow up for RA and gout currently on prednisone 10 mg daily. He stopped taking methotrexate due to running out of prescribed tablets after about 1 month. He saw [facility] for follow up with repeat aspiration and injection in right knee and symptoms stayed better for a month or more before swelling and pain got worse again. No new flare ups anywhere else. He discussed possibility of arthroscopy early next year due to the chronic  symptoms.   Previous HPI 06/04/22 Thomas Schaefer is a 43 y.o. male here for follow up joint inflammation. Since our last visit he saw Dr. Jena Gauss and MRI of right knee obtained showing changes of synovitis and probable ruptured baker's cyst. He has had a steroid injection of the left knee and the left leg is doing well. Hand pain and stiffness are also doing very well mostly just has the knee pain. He started methotrexate since 2 weeks ago tolerating it okay. He decreased prednisone down to 10 mg once daily from twice for a week and is tolerating this okay his knee swelling increased some but not very painful.   Previous HPI 03/30/2022 Thomas Schaefer is a 43 y.o. male here for follow up for joint inflammation. Since our last visit he completed the prednisone his pain was partially improved on this but quickly returned off the medication. Swelling never resolved. He now has increased symptoms involving his hands with decreased grip strength and mobility. He notices increasing pain in left knee and right  ankle he thinks is compensating for his worse affected joints.   Previous HPI 03/14/2022 Thomas Schaefer is a 43 y.o. male here for follow up for joint inflammation.  Shortly after completing most recent steroids he has redeveloped increased swelling of the right knee and swelling and pain in the left ankle.  He noticed the most recent steroid treatment improved his swelling but the left ankle pain was not dramatically improved. So far he has not seen an obvious difference in swelling while taking the sulfasalazine.   Previous HPI 02/27/2022 Thomas Schaefer is a 43 y.o. male here for follow up for joint inflammation. His knee improved greatly after aspiration and with prednisone taper but since 4 days ago left ankle pain and inflammation started. Pain is worst first thing in the morning and when standing up gets partially better when up and walking around. He is not taking anything for this currently    Previous HPI 02/07/22 Thomas Schaefer is a 43 y.o. male here for follow up for joint inflammation previously seen in October initial plan to treat gouty arthritis not clear if also having RA with a positive RF. He has suffered multiple and persistent joint inflammation treated with colchicine and multiple prednisone courses. He started taking allopurinol last month and within days developed severe pain and stiffness in his right knee and some pain extending up to the right hip posteriorly. Pain is intermittent and stabbing, and he cannot fully extend his knee. Symptoms are not similar to anything that he had in the past. He took 2 12 day prednisone tapers these improved symptoms but came back again once down to about 20 mg daily dose. He stopped taking colchicine in accordance with directions when prescribed the prednisone.   Previous HPI 08/21/21 Thomas Schaefer is a 43 y.o. male here for foot pain and inflammation with elevated rheumatoid factor and uric acid.  Symptoms started since around July of this  year with pain on the right foot worse at the bottom of the foot and the smaller for toes. Initial treatment with colchicine had partial benefit in symptoms.  There is also considerable initial swelling.  He received a short course of oral steroids with more symptom improvement but this persisted. He saw Dr. Al Corpus in September treatment with local steroid infection and oral prednisone taper and blood tests were checked.  Inflammatory markers were normal but rheumatoid factor and uric acid were significantly elevated.  Xray of the foot  has shown soft tissue edema without significant osseus abnormalities.  After the injection and additional medication symptoms again improved at this point he is having persistent mild daily symptoms with some pain worsened with pressure and weight on the ball of his foot without much active swelling. He has had some chronic pain and instability of the right knee for a much longer time without any identified problem, xrays have been normal.  He does not drink much alcohol regularly.  He used to drink sodas very heavily but has already decreased this to regular but small amounts before any onset of the symptoms.   Labs reviewed 06/2021 ANA neg RF 125 Uric acid 11.1 ESR 6 CRP 3.0   Review of Systems  Constitutional:  Positive for fatigue.  HENT:  Negative for mouth sores and mouth dryness.   Eyes:  Negative for dryness.  Respiratory:  Negative for shortness of breath.   Cardiovascular:  Negative for chest pain and palpitations.  Gastrointestinal:  Negative for blood in stool, constipation and diarrhea.  Endocrine: Negative for increased urination.  Genitourinary:  Negative for involuntary urination.  Musculoskeletal:  Positive for joint pain, gait problem, joint pain, myalgias, muscle tenderness and myalgias. Negative for joint swelling, muscle weakness and morning stiffness.  Skin:  Negative for color change, rash, hair loss and sensitivity to sunlight.   Allergic/Immunologic: Negative for susceptible to infections.  Neurological:  Negative for dizziness and headaches.  Hematological:  Negative for swollen glands.  Psychiatric/Behavioral:  Positive for depressed mood and sleep disturbance. The patient is not nervous/anxious.     PMFS History:  Patient Active Problem List   Diagnosis Date Noted   Impingement of shoulder 06/04/2023   Right ear pain 03/06/2023   High risk medication use 12/31/2022   Dyslipidemia, goal LDL below 100 10/20/2022   Other fatigue 09/03/2022   Abnormal fasting glucose 09/03/2022   Polyarticular gout 03/30/2022   Seronegative rheumatoid arthritis (HCC) 01/15/2022   Body mass index (BMI) of 31.0-31.9 in adult 01/15/2022   BMI 40.0-44.9, adult (HCC) 12/03/2021   Hyperuricemia 08/21/2021   Obesity 07/13/2015   Mood disorder (HCC) 07/13/2015   Allergic rhinitis 06/06/2007   GERD 06/06/2007   IRRITABLE BOWEL SYNDROME 06/06/2007    Past Medical History:  Diagnosis Date   GERD (gastroesophageal reflux disease)    Hyperlipidemia    Inflammatory polyarthritis (HCC)     Family History  Problem Relation Age of Onset   Mental illness Mother    Drug abuse Mother    Diabetes Father    Alcohol abuse Father    Stroke Father    Alcohol abuse Maternal Uncle    Past Surgical History:  Procedure Laterality Date   KNEE SURGERY Right    TOE SURGERY Left    VASECTOMY     Social History   Social History Narrative   Not on file   Immunization History  Administered Date(s) Administered   Moderna Sars-Covid-2 Vaccination 11/04/2019, 12/03/2019     Objective: Vital Signs: BP 119/77 (BP Location: Left Arm, Patient Position: Sitting, Cuff Size: Large)   Pulse 69   Resp 14   Ht 6\' 1"  (1.854 m)   Wt (!) 313 lb (142 kg)   BMI 41.30 kg/m    Physical Exam Constitutional:      Appearance: He is obese.  Eyes:     Conjunctiva/sclera: Conjunctivae normal.  Cardiovascular:     Rate and Rhythm: Normal rate and  regular rhythm.  Pulmonary:     Effort:  Pulmonary effort is normal.     Breath sounds: Normal breath sounds.  Musculoskeletal:     Right lower leg: No edema.     Left lower leg: No edema.  Lymphadenopathy:     Cervical: No cervical adenopathy.  Skin:    General: Skin is warm and dry.     Findings: No rash.  Neurological:     Mental Status: He is alert.  Psychiatric:        Mood and Affect: Mood normal.      Musculoskeletal Exam:  Shoulders full ROM no tenderness or swelling Elbows full ROM no tenderness or swelling Wrists full ROM no tenderness or swelling Fingers full ROM no tenderness or swelling Anterior hip pain provoked with internal rotation, no lateral tenderness no radiation Knees full ROM no tenderness or swelling   Investigation: No additional findings.  Imaging: No results found.  Recent Labs: Lab Results  Component Value Date   WBC 7.5 09/04/2023   HGB 15.3 09/04/2023   PLT 280 09/04/2023   NA 141 09/04/2023   K 3.8 09/04/2023   CL 104 09/04/2023   CO2 28 09/04/2023   GLUCOSE 61 (L) 09/04/2023   BUN 7 09/04/2023   CREATININE 1.11 09/04/2023   BILITOT 0.6 09/04/2023   ALKPHOS 79 09/03/2022   AST 28 09/04/2023   ALT 38 09/04/2023   PROT 6.9 09/04/2023   ALBUMIN 3.8 (L) 09/03/2022   CALCIUM 9.2 09/04/2023   GFRAA >60 06/24/2017    Speciality Comments: Patient had monitoring labs in 11/2023, media in [facility] message  Procedures:  No procedures performed Allergies: Bee venom   Assessment / Plan:     Visit Diagnoses: Seronegative rheumatoid arthritis (HCC) - Plan: Upadacitinib ER (RINVOQ) 15 MG TB24 Off prednisone with improvement in hand joint symptoms on Rinvoq. Persistent swelling in fingers. Pain in shoulders and knees, particularly with initial movement and weight-bearing. -Continue Rinvoq 15 mg daily.  High risk medication use - Rinvoq 15 mg daily - Plan: Upadacitinib ER (RINVOQ) 15 MG TB24 Toleratin well, no serious interval infections.  He apparently had recent comprehensive lab testing with physical. -Request patient to send recent blood test results via MyChart to monitor blood count, kidney function, and cholesterol.  Impingement of shoulder - Plan: AMB referral to sports medicine Shoulder Pain Discomfort and numbness when lying on shoulder, possibly due to musculoskeletal alignment issues. -Referral to sports medicine, specifically Dr. Antoine Primas, for evaluation and management.  Restless Leg Syndrome Increased symptoms, causing sleep disturbances. No prior treatment. -Consider evaluation for potential treatment options, including gabapentin for nighttime use.  Knee Pain Pain with pressure and initial movement. Has existing crepitus and trace swelling often present but function is better.  Orders: Orders Placed This Encounter  Procedures   AMB referral to sports medicine   Meds ordered this encounter  Medications   Upadacitinib ER (RINVOQ) 15 MG TB24    Sig: Take 1 tablet (15 mg total) by mouth daily.    Dispense:  90 tablet    Refill:  0    Prescription Type::   Renewal     Follow-Up Instructions: Return in about 3 months (around 03/08/2024) for Ra on UPA f/u 3mos.   Fuller Plan, MD  Note - This record has been created using [employer].  Chart creation errors have been sought, but may not always  have been located. Such creation errors do not reflect on  the standard of medical care.

## 2023-12-10 ENCOUNTER — Other Ambulatory Visit: Payer: Self-pay

## 2023-12-10 ENCOUNTER — Encounter: Payer: Self-pay | Admitting: Internal Medicine

## 2023-12-10 ENCOUNTER — Other Ambulatory Visit (HOSPITAL_COMMUNITY): Payer: Self-pay

## 2023-12-10 ENCOUNTER — Ambulatory Visit: Payer: BC Managed Care – PPO | Attending: Internal Medicine | Admitting: Internal Medicine

## 2023-12-10 VITALS — BP 119/77 | HR 69 | Resp 14 | Ht 73.0 in | Wt 313.0 lb

## 2023-12-10 DIAGNOSIS — M0600 Rheumatoid arthritis without rheumatoid factor, unspecified site: Secondary | ICD-10-CM

## 2023-12-10 DIAGNOSIS — Z7952 Long term (current) use of systemic steroids: Secondary | ICD-10-CM

## 2023-12-10 DIAGNOSIS — M25461 Effusion, right knee: Secondary | ICD-10-CM | POA: Diagnosis not present

## 2023-12-10 DIAGNOSIS — M25819 Other specified joint disorders, unspecified shoulder: Secondary | ICD-10-CM | POA: Diagnosis not present

## 2023-12-10 DIAGNOSIS — Z79899 Other long term (current) drug therapy: Secondary | ICD-10-CM | POA: Diagnosis not present

## 2023-12-10 MED ORDER — RINVOQ 15 MG PO TB24
15.0000 mg | ORAL_TABLET | Freq: Every day | ORAL | 0 refills | Status: DC
  Filled 2023-12-10: qty 90, 90d supply, fill #0
  Filled 2023-12-11: qty 30, 30d supply, fill #0
  Filled 2024-01-07: qty 30, 30d supply, fill #1
  Filled 2024-02-07: qty 30, 30d supply, fill #2

## 2023-12-10 NOTE — Patient Instructions (Signed)
I am referring you to see sports medicine about the shoulder and upper back pain and stiffness, preferably Dr. Antoine Primas if available.  Please send a picture or otherwise summary of the recent lab results for your Rinvoq monitoring and we can avoid duplicating it now.

## 2023-12-11 ENCOUNTER — Other Ambulatory Visit: Payer: Self-pay

## 2023-12-11 NOTE — Progress Notes (Signed)
Specialty Pharmacy Refill Coordination Note  Thomas Schaefer is a 43 y.o. male contacted today regarding refills of specialty medication(s) Upadacitinib (Rinvoq)   Patient requested Delivery   Delivery date: 12/20/23   Verified address: [address]   [place] [phone]   Medication will be filled on 12/19/23.

## 2023-12-11 NOTE — Progress Notes (Signed)
Specialty Pharmacy Ongoing Clinical Assessment Note  [name] is a 43 y.o. male who is being followed by the specialty pharmacy service for RxSp Rheumatoid Arthritis   Patient's specialty medication(s) reviewed today: Upadacitinib (Rinvoq)   Missed doses in the last 4 weeks: 0   Patient/Caregiver did not have any additional questions or concerns.   Therapeutic benefit summary: Patient is achieving benefit   Adverse events/side effects summary: No adverse events/side effects   Patient's therapy is appropriate to: Continue    Goals Addressed             This Visit's Progress    Reduce signs and symptoms       Patient is on track. Patient will maintain adherence         Follow up:  6 months  Otto Herb Specialty Pharmacist

## 2023-12-23 NOTE — Progress Notes (Deleted)
 [facility] [address] [place] [address] Phone: [phone] Subjective:    I'm seeing this patient by the request  of:  Sandre Kitty, MD  CC: shoulder pain   [id number]  Thomas Schaefer is a 43 y.o. male coming in with complaint of B shoulder pain. Sees Dr. Dimple Casey for RA. Patient states       Past Medical History:  Diagnosis Date   GERD (gastroesophageal reflux disease)    Hyperlipidemia    Inflammatory polyarthritis (HCC)    Past Surgical History:  Procedure Laterality Date   KNEE SURGERY Right    TOE SURGERY Left    VASECTOMY     Social History   Socioeconomic History   Marital status: Married    Spouse name: Not on file   Number of children: Not on file   Years of education: Not on file   Highest education level: Not on file  Occupational History   Not on file  Tobacco Use   Smoking status: Former    Current packs/day: 0.00    Average packs/day: 1 pack/day for 11.0 years (11.0 ttl pk-yrs)    Types: Cigarettes    Start date: 49    Quit date: 2005    Years since quitting: 20.1    Passive exposure: Past   Smokeless tobacco: Never  Vaping Use   Vaping status: Never Used  Substance and Sexual Activity   Alcohol use: Yes    Comment: rarely   Drug use: No   Sexual activity: Yes  Other Topics Concern   Not on file  Social History Narrative   Not on file   Social Drivers of Health   Financial Resource Strain: Not on file  Food Insecurity: Not on file  Transportation Needs: Not on file  Physical Activity: Not on file  Stress: Not on file  Social Connections: Not on file   Allergies  Allergen Reactions   Bee Venom    Family History  Problem Relation Age of Onset   Mental illness Mother    Drug abuse Mother    Diabetes Father    Alcohol abuse Father    Stroke Father    Alcohol abuse Maternal Uncle      Current Outpatient Medications (Cardiovascular):    EPIPEN 2-PAK 0.3 MG/0.3ML SOAJ injection,     Current Outpatient Medications (Analgesics):    ibuprofen (ADVIL) 200 MG tablet, Take 800 mg by mouth as needed.   Upadacitinib ER (RINVOQ) 15 MG TB24, Take 1 tablet (15 mg total) by mouth daily.     Reviewed prior external information including notes and imaging from  primary care provider As well as notes that were available from care everywhere and other healthcare systems.  Past medical history, social, surgical and family history all reviewed in electronic medical record.  No pertanent information unless stated regarding to the chief complaint.   Review of Systems:  No headache, visual changes, nausea, vomiting, diarrhea, constipation, dizziness, abdominal pain, skin rash, fevers, chills, night sweats, weight loss, swollen lymph nodes, body aches, joint swelling, chest pain, shortness of breath, mood changes. POSITIVE muscle aches  Objective  There were no vitals taken for this visit.   General: No apparent distress alert and oriented x3 mood and affect normal, dressed appropriately.  HEENT: Pupils equal, extraocular movements intact  Respiratory: Patient's speak in full sentences and does not appear short of breath  Cardiovascular: No lower extremity edema, non tender, no erythema  Shoulder  exam shows    Limited muscular skeletal ultrasound was performed and interpreted by Antoine Primas, M     Impression and Recommendations:     The above documentation has been reviewed and is accurate and complete Judi Saa, DO

## 2023-12-24 ENCOUNTER — Ambulatory Visit: Payer: BC Managed Care – PPO | Admitting: Family Medicine

## 2024-01-07 ENCOUNTER — Other Ambulatory Visit: Payer: Self-pay

## 2024-01-07 NOTE — Progress Notes (Signed)
 Specialty Pharmacy Refill Coordination Note  Thomas Schaefer is a 43 y.o. male contacted today regarding refills of specialty medication(s) Upadacitinib (Rinvoq)   Patient requested (Patient-Rptd) Delivery   Delivery date: (Patient-Rptd) 01/17/24   Verified address: (Patient-Rptd) [address] [place], [place] [address]   Medication will be filled on 01/16/24.

## 2024-01-16 ENCOUNTER — Other Ambulatory Visit: Payer: Self-pay

## 2024-01-24 NOTE — Progress Notes (Unsigned)
 [facility] [address] [place] [address] Phone: [phone] Subjective:   Thomas Schaefer, am serving as a scribe for Thomas Schaefer. I'm seeing this patient by the request  of: Rheumatology  CC: Bilateral shoulder pain, sharp  YQI:[id number]  Thomas Schaefer is a 43 y.o. male coming in with complaint of B shoulder pain, past medical history significant for seronegative rheumatoid arthritis as well as hyperuricemia. Patient has pain everywhere but mostly throughout the R shoulder, R elbow and B knees. Patient works as a Theatre stage manager. Throwing a baseball will cause pain that lingers for 2-3 weeks. In 2013 he had MRI and it noted a small tear of RTC.   Hx of R knee arthroscopy in . Was on prednisone for 8 months due to knee continuing to swelling every 4-6 weeks.    Had x-rays of the right shoulder in 2018 showing the patient did have arthritic changes at that time.  Moderate in severity of the acromioclavicular and the glenohumeral.  Past Medical History:  Diagnosis Date   GERD (gastroesophageal reflux disease)    Hyperlipidemia    Inflammatory polyarthritis (HCC)    Past Surgical History:  Procedure Laterality Date   KNEE SURGERY Right    TOE SURGERY Left    VASECTOMY     Social History   Socioeconomic History   Marital status: Married    Spouse name: Not on file   Number of children: Not on file   Years of education: Not on file   Highest education level: Not on file  Occupational History   Not on file  Tobacco Use   Smoking status: Former    Current packs/day: 0.00    Average packs/day: 1 pack/day for 11.0 years (11.0 ttl pk-yrs)    Types: Cigarettes    Start date: 34    Quit date: 2005    Years since quitting: 20.2    Passive exposure: Past   Smokeless tobacco: Never  Vaping Use   Vaping status: Never Used  Substance and Sexual Activity   Alcohol use: Yes    Comment: rarely   Drug use: No   Sexual activity: Yes   Other Topics Concern   Not on file  Social History Narrative   Not on file   Social Drivers of Health   Financial Resource Strain: Not on file  Food Insecurity: Not on file  Transportation Needs: Not on file  Physical Activity: Not on file  Stress: Not on file  Social Connections: Not on file   Allergies  Allergen Reactions   Bee Venom    Family History  Problem Relation Age of Onset   Mental illness Mother    Drug abuse Mother    Diabetes Father    Alcohol abuse Father    Stroke Father    Alcohol abuse Maternal Uncle      Current Outpatient Medications (Cardiovascular):    EPIPEN 2-PAK 0.3 MG/0.3ML SOAJ injection,    Current Outpatient Medications (Analgesics):    ibuprofen (ADVIL) 200 MG tablet, Take 800 mg by mouth as needed.   Upadacitinib ER (RINVOQ) 15 MG TB24, Take 1 tablet (15 mg total) by mouth daily.     Reviewed prior external information including notes and imaging from  primary care provider As well as notes that were available from care everywhere and other healthcare systems.  Past medical history, social, surgical and family history all reviewed in electronic medical record.  No pertanent information unless  stated regarding to the chief complaint.   Review of Systems:  No headache, visual changes, nausea, vomiting, diarrhea, constipation, dizziness, abdominal pain, skin rash, fevers, chills, night sweats, weight loss, swollen lymph nodes, body aches, joint swelling, chest pain, shortness of breath, mood changes. POSITIVE muscle aches  Objective  There were no vitals taken for this visit.   General: No apparent distress alert and oriented x3 mood and affect normal, dressed appropriately.  HEENT: Pupils equal, extraocular movements intact  Respiratory: Patient's speak in full sentences and does not appear short of breath  Cardiovascular: No lower extremity edema, non tender, no erythema      Impression and Recommendations:

## 2024-01-29 ENCOUNTER — Other Ambulatory Visit: Payer: Self-pay

## 2024-01-29 ENCOUNTER — Ambulatory Visit: Admitting: Family Medicine

## 2024-01-29 ENCOUNTER — Ambulatory Visit (INDEPENDENT_AMBULATORY_CARE_PROVIDER_SITE_OTHER)

## 2024-01-29 VITALS — BP 118/78 | HR 77 | Ht 73.0 in | Wt 315.0 lb

## 2024-01-29 DIAGNOSIS — M25562 Pain in left knee: Secondary | ICD-10-CM

## 2024-01-29 DIAGNOSIS — M25561 Pain in right knee: Secondary | ICD-10-CM

## 2024-01-29 DIAGNOSIS — M25512 Pain in left shoulder: Secondary | ICD-10-CM

## 2024-01-29 DIAGNOSIS — M0600 Rheumatoid arthritis without rheumatoid factor, unspecified site: Secondary | ICD-10-CM | POA: Diagnosis not present

## 2024-01-29 DIAGNOSIS — G8929 Other chronic pain: Secondary | ICD-10-CM | POA: Diagnosis not present

## 2024-01-29 DIAGNOSIS — M25521 Pain in right elbow: Secondary | ICD-10-CM

## 2024-01-29 DIAGNOSIS — M25511 Pain in right shoulder: Secondary | ICD-10-CM

## 2024-01-29 DIAGNOSIS — E559 Vitamin D deficiency, unspecified: Secondary | ICD-10-CM | POA: Diagnosis not present

## 2024-01-29 DIAGNOSIS — M2550 Pain in unspecified joint: Secondary | ICD-10-CM

## 2024-01-29 DIAGNOSIS — E790 Hyperuricemia without signs of inflammatory arthritis and tophaceous disease: Secondary | ICD-10-CM | POA: Diagnosis not present

## 2024-01-29 DIAGNOSIS — M778 Other enthesopathies, not elsewhere classified: Secondary | ICD-10-CM | POA: Diagnosis not present

## 2024-01-29 LAB — CK: Total CK: 243 U/L — ABNORMAL HIGH (ref 7–232)

## 2024-01-29 LAB — IBC PANEL
Iron: 155 ug/dL (ref 42–165)
Saturation Ratios: 38.7 % (ref 20.0–50.0)
TIBC: 400.4 ug/dL (ref 250.0–450.0)
Transferrin: 286 mg/dL (ref 212.0–360.0)

## 2024-01-29 LAB — TESTOSTERONE: Testosterone: 252.83 ng/dL — ABNORMAL LOW (ref 300.00–890.00)

## 2024-01-29 LAB — SEDIMENTATION RATE: Sed Rate: 2 mm/h (ref 0–15)

## 2024-01-29 LAB — VITAMIN D 25 HYDROXY (VIT D DEFICIENCY, FRACTURES): VITD: 17.96 ng/mL — ABNORMAL LOW (ref 30.00–100.00)

## 2024-01-29 LAB — VITAMIN B12: Vitamin B-12: 320 pg/mL (ref 211–911)

## 2024-01-29 LAB — FERRITIN: Ferritin: 141.3 ng/mL (ref 22.0–322.0)

## 2024-01-29 LAB — TSH: TSH: 1.36 u[IU]/mL (ref 0.35–5.50)

## 2024-01-29 LAB — URIC ACID: Uric Acid, Serum: 6.6 mg/dL (ref 4.0–7.8)

## 2024-01-29 NOTE — Patient Instructions (Signed)
 Labs Tart cherry extract 3600mg  at night Vit D 2000IU daily  We will get back to you on labwork See me 6-8 weeks

## 2024-01-29 NOTE — Assessment & Plan Note (Signed)
 Concerned that the hyperuricemia is causing more the discomfort.  Do not see a true synovitis of any of the joints on ultrasound but I do see some uric acid deposits in the acromioclavicular, glenohumeral, as well as the right knee.  Will get laboratory workup to further evaluate but do think there is a possibility we should consider the possibility of treatment.  Discussed over-the-counter natural supplementation as well.  May need to consider potential injections in the long run.  Encouraged potential diet changes but did not go into great detail at the moment.  Will follow-up with me again in 6 to 8 weeks no matter what but will await some of the lab test to see if anything else is contributing.

## 2024-01-29 NOTE — Assessment & Plan Note (Signed)
 Seeing rheumatology.  Getting treated appropriately.  Definitely something else that is contributing but will do other lab work to make sure nothing else is potentially causing a multifactorial problem.  Patient is in agreement with the plan.

## 2024-01-30 ENCOUNTER — Telehealth: Payer: Self-pay

## 2024-01-30 ENCOUNTER — Encounter: Payer: Self-pay | Admitting: Family Medicine

## 2024-01-30 DIAGNOSIS — E559 Vitamin D deficiency, unspecified: Secondary | ICD-10-CM | POA: Insufficient documentation

## 2024-01-30 MED ORDER — VITAMIN D (ERGOCALCIFEROL) 1.25 MG (50000 UNIT) PO CAPS: 50000.0000 [IU] | ORAL_CAPSULE | ORAL | 0 refills | Status: DC

## 2024-01-30 NOTE — Assessment & Plan Note (Signed)
 Left mid back after patient's visit.  Significantly low vitamin D.  Sending once weekly vitamin D prescription.

## 2024-01-30 NOTE — Telephone Encounter (Signed)
 Can you call the patient to ask him the following:   -what symptoms is he having that makes him think he has sleep apnea?  - does he have daytime fatigue or somnolence? (Is he falling asleep during the day?) - has he ever been told he snores or stops breathing in his sleep?   I'll need to know these things before I can send in a sleep study order.    Also ask if he would be comfortable doing the sleep study at his home.

## 2024-01-30 NOTE — Telephone Encounter (Signed)
 Copied from CRM [id number]. Topic: Referral - Request for Referral >> Jan 30, 2024 11:13 AM Truddie Crumble wrote: Did the patient discuss referral with their provider in the last year? No (If No - schedule appointment) (If Yes - send message)  Appointment offered? No  Type of order/referral and detailed reason for visit: sleep study  Preference of office, provider, location: [place]  If referral order, have you been seen by this specialty before? Yes (If Yes, this issue or another issue? When? Where?  Can we respond through MyChart? Yes

## 2024-01-31 NOTE — Telephone Encounter (Signed)
 If he is talking about the settings for the cpap, it looks like he was titrated to a setting of 9 cmH20 during his sleep study.

## 2024-01-31 NOTE — Telephone Encounter (Signed)
 Pt informed of below.

## 2024-01-31 NOTE — Telephone Encounter (Signed)
 Pt states he had a sleep study previously and diagnosed with sleep apnea, he says he snores, stops breathing and is fatigued.  He said his machine broke and he bought a machine from a friend and he just needs to know the numbers for the machine.  I informed him that I would send message to Dr. Constance Goltz to see if he could locate those numbers and that he should be able to call the manufacture to help him set them with his current machine.  If another sleep study is needed he said that he is ok with doing a home one if they would not let him do one in the lab.

## 2024-02-01 LAB — PTH, INTACT AND CALCIUM
Calcium: 9.5 mg/dL (ref 8.6–10.3)
PTH: 21 pg/mL (ref 16–77)

## 2024-02-05 ENCOUNTER — Other Ambulatory Visit: Payer: Self-pay

## 2024-02-06 ENCOUNTER — Other Ambulatory Visit: Payer: Self-pay

## 2024-02-06 ENCOUNTER — Ambulatory Visit
Admission: RE | Admit: 2024-02-06 | Discharge: 2024-02-06 | Disposition: A | Discharge status: Home / Self Care | Source: Ambulatory Visit | Attending: Family Medicine | Admitting: Family Medicine

## 2024-02-06 VITALS — BP 116/80 | HR 100 | Temp 99.5°F | Resp 18

## 2024-02-06 DIAGNOSIS — J209 Acute bronchitis, unspecified: Secondary | ICD-10-CM | POA: Diagnosis not present

## 2024-02-06 DIAGNOSIS — J019 Acute sinusitis, unspecified: Secondary | ICD-10-CM | POA: Diagnosis not present

## 2024-02-06 LAB — POC COVID19/FLU A&B COMBO
Covid Antigen, POC: NEGATIVE
Influenza A Antigen, POC: NEGATIVE
Influenza B Antigen, POC: NEGATIVE

## 2024-02-06 MED ORDER — BENZONATATE 200 MG PO CAPS: 200.0000 mg | ORAL_CAPSULE | Freq: Three times a day (TID) | ORAL | 0 refills | Status: AC | PRN

## 2024-02-06 MED ORDER — AMOXICILLIN-POT CLAVULANATE 875-125 MG PO TABS: 1.0000 | ORAL_TABLET | Freq: Two times a day (BID) | ORAL | 0 refills | Status: DC

## 2024-02-06 MED ORDER — PREDNISONE 20 MG PO TABS: 20.0000 mg | ORAL_TABLET | Freq: Two times a day (BID) | ORAL | 0 refills | Status: AC

## 2024-02-06 NOTE — ED Provider Notes (Signed)
 [facility]    CSN: [id number] Arrival date & time: 02/06/24  1253      History   Chief Complaint Chief Complaint  Patient presents with   Nasal Congestion    HPI Thomas Schaefer is a 43 y.o. male.   HPI Patient presents for evaluation of nasal congestion, chills, fever, sore throat x 4 days. Arrival patient has a low-grade temp of 99.5.  Patient took 600 mg of ibuprofen prior to arrival and has been taking Tessalon Perles for cough.  No known sick exposures.   He denies shortness of breath or wheezing.  Patient is a non-smoker.  Patient has a history of seasonal allergies. 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 Authorizing Provider  amoxicillin-clavulanate (AUGMENTIN) 875-125 MG tablet Take 1 tablet by mouth every 12 (twelve) hours. 02/06/24  Yes Buena Carmine, NP  benzonatate (TESSALON) 200 MG capsule Take 1 capsule (200 mg total) by mouth 3 (three) times daily as needed for cough. 02/06/24  Yes  Buena Carmine, NP  ibuprofen (ADVIL) 200 MG tablet Take 800 mg by mouth as needed.   Yes [provider]  predniSONE (DELTASONE) 20 MG tablet Take 1 tablet (20 mg total) by mouth 2 (two) times daily with a meal for 5 days. 02/06/24 02/11/24 Yes Buena Carmine, NP  Upadacitinib ER (RINVOQ) 15 MG TB24 Take 1 tablet (15 mg total) by mouth daily. 12/10/23  Yes Rice, Haig Levan, MD  Vitamin D, Ergocalciferol, (DRISDOL) 1.25 MG (50000 UNIT) CAPS capsule Take 1 capsule (50,000 Units total) by mouth every 7 (seven) days. 01/30/24  Yes Isidro Margo, DO  EPIPEN 2-PAK 0.3 MG/0.3ML SOAJ injection  04/20/15   [provider]    Family History Family History  Problem Relation Age of Onset   Mental illness Mother    Drug abuse Mother    Diabetes Father    Alcohol abuse Father    Stroke Father    Alcohol abuse Maternal Uncle     Social History Social History   Tobacco Use   Smoking status: Former    Current packs/day: 0.00    Average packs/day: 1 pack/day for 11.0 years (11.0 ttl pk-yrs)    Types: Cigarettes    Start date: 27    Quit date: 2005    Years since quitting: 20.2    Passive exposure: Past   Smokeless tobacco: Never  Vaping Use   Vaping  status: Never Used  Substance Use Topics   Alcohol use: Not Currently    Comment: rarely   Drug use: No     Allergies   Bee venom   Review of Systems Review of Systems Pertinent negatives listed in HPI   Physical Exam Triage Vital Signs ED Triage Vitals  Encounter Vitals Group     BP 02/06/24 1315 116/80     Systolic BP Percentile --      Diastolic BP Percentile --      Pulse Rate 02/06/24 1315 100     Resp 02/06/24 1315 18     Temp 02/06/24 1315 99.5 F (37.5 C)     Temp Source 02/06/24 1315 Oral     SpO2 02/06/24 1315 97 %     Weight --      Height --      Head Circumference --      Peak Flow --      Pain Score 02/06/24 1311 6     Pain Loc --      Pain Education --      Exclude from Growth  Chart --    No data found.  Updated Vital Signs BP 116/80 (BP Location: Left Arm)   Pulse 100   Temp 99.5 F (37.5 C) (Oral)   Resp 18   SpO2 97%   Visual Acuity Right Eye Distance:   Left Eye Distance:   Bilateral Distance:    Right Eye Near:   Left Eye Near:    Bilateral Near:     Physical Exam Vitals reviewed.  HENT:     Head: Normocephalic and atraumatic.     Right Ear: Tympanic membrane, ear canal and external ear normal.     Left Ear: Tympanic membrane, ear canal and external ear normal.     Nose: Congestion and rhinorrhea present.  Eyes:     Extraocular Movements: Extraocular movements intact.     Conjunctiva/sclera: Conjunctivae normal.     Pupils: Pupils are equal, round, and reactive to light.  Cardiovascular:     Rate and Rhythm: Normal rate and regular rhythm.  Pulmonary:     Effort: Pulmonary effort is normal.     Breath sounds: Rhonchi present.  Musculoskeletal:     Cervical back: Normal range of motion and neck supple.  Skin:    General: Skin is warm and dry.  Neurological:     General: No focal deficit present.     Mental Status: He is alert and oriented to person, place, and time.     UC Treatments / Results  Labs (all labs ordered are listed, but only abnormal results are displayed) Labs Reviewed  POC COVID19/FLU A&B COMBO - Normal    EKG   Radiology No results found.  Procedures Procedures (including critical care time)  Medications Ordered in UC Medications - No data to display  Initial Impression / Assessment and Plan / UC Course  I have reviewed the triage vital signs and the nursing notes.  Pertinent labs & imaging results that were available during my care of the patient were reviewed by me and considered in my medical decision making (see chart for details).   1. Acute bronchitis, unspecified organism (Primary) -Prednisone 20 mg x 5 days 2. Acute sinusitis, recurrence not specified, unspecified location  -Augmentin BID  x 7 days  Negative COVID/Flu test Hydrate well with fluid. Return precaution given if symptoms worsen or do not improve. Final Clinical Impressions(s) / UC Diagnoses  Final diagnoses:  Acute bronchitis, unspecified organism  Acute sinusitis, recurrence not specified, unspecified location     Discharge Instructions      Flu test are negative.  Given symptoms you are experiencing and evaluation findings I am treating first suspected sinus infection and bronchitis.  Start Augmentin twice daily for 7 days treat any underlying upper respiratory and lower respiratory infection.  Prednisone 20 mg once daily for 5 days will improve your cough and treat the inflammation in your chest.      ED Prescriptions     Medication Sig Dispense Auth. Provider   amoxicillin-clavulanate (AUGMENTIN) 875-125 MG tablet Take 1 tablet by mouth every 12 (twelve) hours. 14 tablet Buena Carmine, NP   predniSONE (DELTASONE) 20 MG tablet Take 1 tablet (20 mg total) by mouth 2 (two) times daily with a meal for 5 days. 10 tablet Buena Carmine, NP   benzonatate (TESSALON) 200 MG capsule Take 1 capsule (200 mg total) by mouth 3 (three) times daily as needed for cough. 40 capsule Buena Carmine, NP      PDMP not reviewed this encounter.   Buena Carmine, NP 02/09/24 1204

## 2024-02-06 NOTE — Discharge Instructions (Addendum)
 Flu test are negative.  Given symptoms you are experiencing and evaluation findings I am treating first suspected sinus infection and bronchitis.  Start Augmentin twice daily for 7 days treat any underlying upper respiratory and lower respiratory infection.  Prednisone 20 mg once daily for 5 days will improve your cough and treat the inflammation in your chest.

## 2024-02-06 NOTE — ED Triage Notes (Addendum)
 Nasal congestion x 4 days- Yesterday had cough, subjective fever, chills, sore throat. Productive cough today for "yellowish-green stuff". He took 600mg  ibuprofen, OTC congestion med and tessalon just pta. States he believes it is allergies

## 2024-02-07 ENCOUNTER — Other Ambulatory Visit: Payer: Self-pay

## 2024-02-07 NOTE — Progress Notes (Signed)
 Specialty Pharmacy Refill Coordination Note  Thomas Schaefer is a 43 y.o. male contacted today regarding refills of specialty medication(s) Upadacitinib (Rinvoq)   Patient requested Delivery   Delivery date: 02/17/24   Verified address: [address] [place], [place] [address]   Medication will be filled on 02/14/24.

## 2024-02-14 ENCOUNTER — Other Ambulatory Visit: Payer: Self-pay

## 2024-02-20 NOTE — Progress Notes (Deleted)
 Office Visit Note  Patient: Thomas Schaefer             Date of Birth: November 23, 1980           MRN: [id number]             PCP: Laneta Pintos, MD Referring: Laneta Pintos, MD Visit Date: 03/04/2024   Subjective:  No chief complaint on file.   History of Present Illness: Thomas Schaefer is a 43 y.o. male here for follow up for seronegative RA on Rinvoq  15 mg p.o. daily.    Previous HPI 12/10/2023 Thomas Schaefer is a 43 y.o. male here for follow up for seronegative RA on Rinvoq  15 mg p.o. daily.   He is managing rheumatoid arthritis with Rinvoq  after discontinuing prednisone . Rinvoq  has effectively reduced pain in his hand joints, allowing him to close his fist without pain, though his fingers remain swollen, preventing him from wearing his ring. 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 The pain subsides after the first 10 to 20 steps.   He experiences sleep disturbances due to shoulder pain and restless legs. He describes numbness and a restless sensation in his shoulders when lying down, necessitating frequent position changes. His restless leg symptoms have worsened, characterized by an uncomfortable sensation that compels movement for relief. He has not previously taken medication for restless legs and denies any history of iron deficiency.   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 The results were normal except for slightly elevated cholesterol levels. No recent illnesses have been reported.      Previous HPI 09/04/2023 Thomas Schaefer is a 43 y.o. male here for follow up for seronegative RA on Rinvoq  15 mg p.o. daily and prednisone  5 mg twice daily.  Since witching to Rinvoq  he is overall mostly doing better.  Most of his joint pain and stiffness  outside of the right knee is controlled with just the Rinvoq .  He does notice symptoms creeping in a bit worse by the end of the day.  He decreased the prednisone  now taking just 5 mg once daily if he stops this for 2 or 3 days he redeveloped significant knee swelling.  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   Previous HPI 06/04/2023 Thomas Schaefer is a 43 y.o. male here for follow up for seronegative RA on Enbrel  50 mg subcu weekly and prednisone  5 mg twice daily.  He has tried decreasing prednisone  to only once daily on some days but when stopping completely he quickly saw increase in joint pain and swelling within days.  He has a stable amount of persistent right knee swelling.  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  After the blistering at the sting site drained swelling has mostly gone down except in his left index finger.  We spoke on the phone last month due to increasing trouble with pain in his shoulders with some numbness tingling and sometimes painful sensation radiating down the arms.  Especially has trouble with shoulder pain at night if he lies on either side which frequently disrupts his sleep.  Recommended neurology evaluation for nerve conduction study but he missed this appointment due to being sick with COVID and needs to reschedule.   Previous HPI 03/06/2023 Thomas Schaefer is a 43 y.o. male here for follow up for seronegative RA on Enbrel  now for 1 month and on prednisone  5 mg twice daily.  He is not noticing trouble with the Enbrel  but also has not seen a significant improvement in joint symptoms.  Symptoms are mostly controlled with 5 mg twice daily prednisone  still having  swelling in his hands and wrists and bilateral shoulder pain.  Shoulder pain is most bothersome at nighttime and frequently awakens him from sleep requiring repositioning.  Also gets pain during the day most with overhead movements.  When decreasing the prednisone  further he notices some right-sided pain at the lower jaw and ear.  He has had pain in this area in multiple occasions on the past without specific cause identified that usually has resolved very quickly with antibiotic treatment.   Previous HPI 01/23/23 Thomas Schaefer is a 43 y.o. male here for follow up for seronegative rheumatoid arthritis on leflunomide  20 mg daily.  Since last visit he had right knee arthroscopic surgery with some debridement of extensive synovial hypertrophy and some loose cartilage bodies.  Afterwards he discontinued prednisone  completely and after about 4 days started feeling generalized worsening of joint pain and stiffness and severe fatigue.  Initially tried managing symptoms with ibuprofen 800 mg after calling us  I recommended resuming a lower dose prednisone  and gradually tapering it off at 2-week increment.  So he is back to taking 5 mg twice daily right now and ibuprofen 800 mg every 6 hours and has been out of work for the past 2 weeks.  He is not having trouble taking the leflunomide  but has not seen any improvement yet.  His right knee actually feels well and everywhere else is more painful and inflamed.  Has bilateral hand swelling.  Notices weakness inability to grip and lift strongly with his arms.  Also bilateral shoulder pain especially with overhead abduction and lying on his side in bed.   3/11 Right knee surgical pathology Chronically inflamed synovium with degenerative changes   Previous HPI 12/31/22 Thomas Schaefer is a 43 y.o. male here for follow up for RA and gout currently on prednisone  10 mg daily.  Right now he is in a symptom exacerbation doing much worse had a flareup after an exercise training  session last week.  He is having pain in his right knee which has been the main ongoing issue also having more trouble with his shoulders right now with pain worse posteriorly especially feels increased pain when lying on his side in bed.  He has had some continued weight gain with the long-term prednisone  use starting phentermine  for weight loss has not seen any progress so far.  He is scheduled for right knee surgery coming up in 1 week.   Previous HPI 08/27/22 Thomas Schaefer is a 43 y.o. male here for follow up for RA and gout currently on prednisone  10 mg daily. He stopped taking methotrexate  due to running out of prescribed tablets after about 1 month. He saw [facility] for follow up with repeat aspiration and injection in right knee and symptoms stayed better for a month or more before swelling and pain got worse again. No new flare ups anywhere else. He  discussed possibility of arthroscopy early next year due to the chronic symptoms.   Previous HPI 06/04/22 Thomas Schaefer is a 43 y.o. male here for follow up joint inflammation. Since our last visit he saw Dr. Curtiss Dowdy and MRI of right knee obtained showing changes of synovitis and probable ruptured baker's cyst. He has had a steroid injection of the left knee and the left leg is doing well. Hand pain and stiffness are also doing very well mostly just has the knee pain. He started methotrexate  since 2 weeks ago tolerating it okay. He decreased prednisone  down to 10 mg once daily from twice for a week and is tolerating this okay his knee swelling increased some but not very painful.   Previous HPI 03/30/2022 Thomas Schaefer is a 43 y.o. male here for follow up for joint inflammation. Since our last visit he completed the prednisone  his pain was partially improved on this but quickly returned off the medication. Swelling never resolved. He now has increased symptoms involving his hands with decreased grip strength and mobility. He notices  increasing pain in left knee and right ankle he thinks is compensating for his worse affected joints.   Previous HPI 03/14/2022 Thomas Schaefer is a 43 y.o. male here for follow up for joint inflammation.  Shortly after completing most recent steroids he has redeveloped increased swelling of the right knee and swelling and pain in the left ankle.  He noticed the most recent steroid treatment improved his swelling but the left ankle pain was not dramatically improved. So far he has not seen an obvious difference in swelling while taking the sulfasalazine .   Previous HPI 02/27/2022 Thomas Schaefer is a 43 y.o. male here for follow up for joint inflammation. His knee improved greatly after aspiration and with prednisone  taper but since 4 days ago left ankle pain and inflammation started. Pain is worst first thing in the morning and when standing up gets partially better when up and walking around. He is not taking anything for this currently    Previous HPI 02/07/22 Thomas Schaefer is a 43 y.o. male here for follow up for joint inflammation previously seen in October initial plan to treat gouty arthritis not clear if also having RA with a positive RF. He has suffered multiple and persistent joint inflammation treated with colchicine  and multiple prednisone  courses. He started taking allopurinol  last month and within days developed severe pain and stiffness in his right knee and some pain extending up to the right hip posteriorly. Pain is intermittent and stabbing, and he cannot fully extend his knee. Symptoms are not similar to anything that he had in the past. He took 2 12 day prednisone  tapers these improved symptoms but came back again once down to about 20 mg daily dose. He stopped taking colchicine  in accordance with directions when prescribed the prednisone .   Previous HPI 08/21/21 Thomas Schaefer is a 43 y.o. male here for foot pain and inflammation with elevated rheumatoid factor and uric acid.   Symptoms started since around July of this year with pain on the right foot worse at the bottom of the foot and the smaller for toes. Initial treatment with colchicine  had partial benefit in symptoms.  There is also considerable initial swelling.  He received a short course of oral steroids with more symptom improvement but this persisted. He saw Dr. Lara Plants in September treatment with local steroid infection and oral prednisone  taper and blood tests were checked.  Inflammatory markers were normal but rheumatoid factor  and uric acid were significantly elevated.  Xray of the foot has shown soft tissue edema without significant osseus abnormalities.  After the injection and additional medication symptoms again improved at this point he is having persistent mild daily symptoms with some pain worsened with pressure and weight on the ball of his foot without much active swelling. He has had some chronic pain and instability of the right knee for a much longer time without any identified problem, xrays have been normal.  He does not drink much alcohol regularly.  He used to drink sodas very heavily but has already decreased this to regular but small amounts before any onset of the symptoms.   Labs reviewed 06/2021 ANA neg RF 125 Uric acid 11.1 ESR 6 CRP 3.0   No Rheumatology ROS completed.   PMFS History:  Patient Active Problem List   Diagnosis Date Noted   Vitamin D  deficiency 01/30/2024   Impingement of shoulder 06/04/2023   Right ear pain 03/06/2023   High risk medication use 12/31/2022   Dyslipidemia, goal LDL below 100 10/20/2022   Other fatigue 09/03/2022   Abnormal fasting glucose 09/03/2022   Polyarticular gout 03/30/2022   Seronegative rheumatoid arthritis (HCC) 01/15/2022   Body mass index (BMI) of 31.0-31.9 in adult 01/15/2022   BMI 40.0-44.9, adult (HCC) 12/03/2021   Hyperuricemia 08/21/2021   Obesity 07/13/2015   Mood disorder (HCC) 07/13/2015   Allergic rhinitis 06/06/2007    GERD 06/06/2007   IRRITABLE BOWEL SYNDROME 06/06/2007    Past Medical History:  Diagnosis Date   GERD (gastroesophageal reflux disease)    Hyperlipidemia    Inflammatory polyarthritis (HCC)     Family History  Problem Relation Age of Onset   Mental illness Mother    Drug abuse Mother    Diabetes Father    Alcohol abuse Father    Stroke Father    Alcohol abuse Maternal Uncle    Past Surgical History:  Procedure Laterality Date   KNEE SURGERY Right    TOE SURGERY Left    VASECTOMY     Social History   Social History Narrative   Not on file   Immunization History  Administered Date(s) Administered   Moderna Sars-Covid-2 Vaccination 11/04/2019, 12/03/2019     Objective: Vital Signs: There were no vitals taken for this visit.   Physical Exam   Musculoskeletal Exam: ***  CDAI Exam: CDAI Score: -- Patient Global: --; Provider Global: -- Swollen: --; Tender: -- Joint Exam 03/04/2024   No joint exam has been documented for this visit   There is currently no information documented on the homunculus. Go to the Rheumatology activity and complete the homunculus joint exam.  Investigation: No additional findings.  Imaging: US  LIMITED JOINT SPACE STRUCTURES UP BILAT(NO LINKED CHARGES) Result Date: 02/03/2024 No images saved   DG ELBOW COMPLETE RIGHT (3+VIEW) Result Date: 01/30/2024 CLINICAL DATA:  Chronic right elbow pain. EXAM: RIGHT ELBOW - COMPLETE 3+ VIEW COMPARISON:  None Available. FINDINGS: There is no evidence of fracture, dislocation, or joint effusion. The alignment and joint spaces are normal. No erosions or focal bone abnormality. There may be slight spurring about the medial humeral epicondyle. IMPRESSION: Possible slight spurring about the medial humeral epicondyle. Otherwise unremarkable radiographs of the right elbow. Electronically Signed   By: Chadwick Colonel M.D.   On: 01/30/2024 09:38   DG Knee Bilateral Standing AP Result Date: 01/30/2024 CLINICAL  DATA:  Chronic bilateral knee pain. EXAM: BILATERAL KNEES STANDING - 1 VIEW COMPARISON:  None  Available. FINDINGS: Single frontal view of both knees submitted. Normal alignment and joint spaces. There is trace peripheral spurring in the right knee medial tibiofemoral compartment. No erosive change or evidence of focal bone abnormality on this frontal view. IMPRESSION: Trace peripheral spurring in the right knee medial tibiofemoral compartment. Electronically Signed   By: Chadwick Colonel M.D.   On: 01/30/2024 09:36   DG Shoulder Right Result Date: 01/30/2024 CLINICAL DATA:  Right shoulder pain. EXAM: RIGHT SHOULDER - 2+ VIEW COMPARISON:  08/27/2017 FINDINGS: There is no evidence of fracture or dislocation. Minor acromioclavicular spurring. The glenohumeral joint space is preserved, trace inferior glenoid spurring. There is no evidence of other focal bone abnormality. Soft tissues are unremarkable. IMPRESSION: Minor acromioclavicular and trace glenohumeral degenerative spurring. Electronically Signed   By: Chadwick Colonel M.D.   On: 01/30/2024 09:36    Recent Labs: Lab Results  Component Value Date   WBC 7.5 09/04/2023   HGB 15.3 09/04/2023   PLT 280 09/04/2023   NA 141 09/04/2023   K 3.8 09/04/2023   CL 104 09/04/2023   CO2 28 09/04/2023   GLUCOSE 61 (L) 09/04/2023   BUN 7 09/04/2023   CREATININE 1.11 09/04/2023   BILITOT 0.6 09/04/2023   ALKPHOS 79 09/03/2022   AST 28 09/04/2023   ALT 38 09/04/2023   PROT 6.9 09/04/2023   ALBUMIN 3.8 (L) 09/03/2022   CALCIUM 9.5 01/29/2024   GFRAA >60 06/24/2017    Speciality Comments: Patient had monitoring labs in 11/2023, media in [place] message  Procedures:  No procedures performed Allergies: Bee venom   Assessment / Plan:     Visit Diagnoses: No diagnosis found.  ***  Orders: No orders of the defined types were placed in this encounter.  No orders of the defined types were placed in this encounter.    Follow-Up Instructions: No  follow-ups on file.   Glena Landau, RT  Note - This record has been created using [employer].  Chart creation errors have been sought, but may not always  have been located. Such creation errors do not reflect on  the standard of medical care.

## 2024-03-04 ENCOUNTER — Encounter: Payer: Self-pay | Admitting: Emergency Medicine

## 2024-03-04 ENCOUNTER — Ambulatory Visit: Payer: BC Managed Care – PPO | Admitting: Internal Medicine

## 2024-03-04 ENCOUNTER — Ambulatory Visit
Admission: EM | Admit: 2024-03-04 | Discharge: 2024-03-04 | Disposition: A | Discharge status: Home / Self Care | Attending: Family Medicine | Admitting: Family Medicine

## 2024-03-04 DIAGNOSIS — Z9189 Other specified personal risk factors, not elsewhere classified: Secondary | ICD-10-CM | POA: Diagnosis not present

## 2024-03-04 DIAGNOSIS — M0600 Rheumatoid arthritis without rheumatoid factor, unspecified site: Secondary | ICD-10-CM

## 2024-03-04 DIAGNOSIS — R0602 Shortness of breath: Secondary | ICD-10-CM

## 2024-03-04 DIAGNOSIS — M25819 Other specified joint disorders, unspecified shoulder: Secondary | ICD-10-CM

## 2024-03-04 DIAGNOSIS — Z79899 Other long term (current) drug therapy: Secondary | ICD-10-CM

## 2024-03-04 DIAGNOSIS — M25461 Effusion, right knee: Secondary | ICD-10-CM

## 2024-03-04 DIAGNOSIS — J4551 Severe persistent asthma with (acute) exacerbation: Secondary | ICD-10-CM

## 2024-03-04 MED ORDER — ALBUTEROL SULFATE HFA 108 (90 BASE) MCG/ACT IN AERS: 2.0000 | INHALATION_SPRAY | RESPIRATORY_TRACT | Status: AC | PRN

## 2024-03-04 MED ORDER — ALBUTEROL SULFATE HFA 108 (90 BASE) MCG/ACT IN AERS
2.0000 | INHALATION_SPRAY | Freq: Four times a day (QID) | RESPIRATORY_TRACT | Status: DC | PRN
  Administered 2024-03-04: 2 via RESPIRATORY_TRACT

## 2024-03-04 MED ORDER — IPRATROPIUM-ALBUTEROL 0.5-2.5 (3) MG/3ML IN SOLN
3.0000 mL | Freq: Once | RESPIRATORY_TRACT | Status: AC
  Administered 2024-03-04: 3 mL via RESPIRATORY_TRACT

## 2024-03-04 MED ORDER — PREDNISONE 20 MG PO TABS: ORAL_TABLET | ORAL | 0 refills | Status: AC

## 2024-03-04 MED ORDER — LEVOCETIRIZINE DIHYDROCHLORIDE 5 MG PO TABS
5.0000 mg | ORAL_TABLET | Freq: Every evening | ORAL | 0 refills | Status: AC
Start: 2024-03-04 — End: ?

## 2024-03-04 MED ORDER — PROMETHAZINE-DM 6.25-15 MG/5ML PO SYRP: 5.0000 mL | ORAL_SOLUTION | Freq: Four times a day (QID) | ORAL | 0 refills | Status: AC | PRN

## 2024-03-04 MED ORDER — CEFDINIR 300 MG PO CAPS: 300.0000 mg | ORAL_CAPSULE | Freq: Two times a day (BID) | ORAL | 0 refills | Status: AC

## 2024-03-04 NOTE — ED Provider Notes (Signed)
 [facility]    CSN: [id number] Arrival date & time: 03/04/24  1608      History   Chief Complaint Chief Complaint  Patient presents with   Sore Throat   Cough   Emesis    HPI Thomas Schaefer is a 43 y.o. male.  Here for evaluation of worsening cough, shortness of breath throat irritation and off causing him to vomit over the last few days.  Patient seen here at urgent care approximately a month ago treated for upper respiratory illness with antibiotics and prednisone  and patient endorses that he did not significantly get better.  He reports over the past week he is having difficulty at work as he is coughing and having shortness of breath.  He reports having mild symptoms similar to this last spring season however he is never experienced a prolonged period of coughing and wheezing and shortness of breath.  Patient this PCP however was unable to be seen today and referred here for treatment.  Patient denies any history of asthma and has a distant history of smoking.  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  Patient is receiving immunosuppressive therapy due to rheumatoid arthritis followed by rheumatology.  He has not experienced any fever throughout this current course of illness.  Past Medical History:  Diagnosis Date   GERD (gastroesophageal reflux disease)    Hyperlipidemia    Inflammatory polyarthritis (HCC)     Patient Active Problem List   Diagnosis Date Noted   Vitamin D  deficiency 01/30/2024   Impingement of shoulder 06/04/2023   Right ear pain 03/06/2023   High risk medication use 12/31/2022   Dyslipidemia, goal LDL below 100 10/20/2022   Other fatigue 09/03/2022   Abnormal fasting glucose 09/03/2022   Polyarticular gout 03/30/2022   Seronegative rheumatoid arthritis (HCC) 01/15/2022   Body mass index (BMI) of 31.0-31.9 in adult 01/15/2022   BMI 40.0-44.9, adult (HCC) 12/03/2021   Hyperuricemia 08/21/2021   Obesity  07/13/2015   Mood disorder (HCC) 07/13/2015   Allergic rhinitis 06/06/2007   GERD 06/06/2007   IRRITABLE BOWEL SYNDROME 06/06/2007    Past Surgical History:  Procedure Laterality Date   KNEE SURGERY Right    TOE SURGERY Left    VASECTOMY         Home Medications    Prior to Admission medications   Medication Sig Start Date End Date Taking? Authorizing Provider  albuterol (VENTOLIN HFA) 108 (90 Base) MCG/ACT inhaler Inhale 2 puffs into the lungs every 4 (four) hours as needed for wheezing or shortness of breath. 03/04/24  Yes Buena Carmine, NP  benzonatate  (TESSALON ) 200 MG capsule Take 1 capsule (200 mg total) by mouth 3 (three) times daily as needed for cough. 02/06/24  Yes Buena Carmine, NP  cefdinir (OMNICEF) 300 MG capsule Take 1 capsule (300 mg total) by mouth 2 (two) times daily for 10 days. 03/04/24 03/14/24 Yes Buena Carmine, NP  ibuprofen (ADVIL) 200 MG tablet Take 800 mg by mouth as needed.   Yes [provider]  levocetirizine (XYZAL) 5 MG tablet Take 1 tablet (5 mg total) by mouth every evening. 03/04/24  Yes Buena Carmine, NP  predniSONE  (DELTASONE ) 20 MG tablet Take 3 tablets (60 mg total) by mouth daily with breakfast for 2 days, THEN 2 tablets (40 mg total) daily with breakfast for 3 days. 03/04/24 03/09/24 Yes Buena Carmine, NP  promethazine-dextromethorphan (PROMETHAZINE-DM) 6.25-15 MG/5ML syrup Take 5 mLs by mouth  4 (four) times daily as needed for cough. 03/04/24  Yes Buena Carmine, NP  Upadacitinib  ER (RINVOQ ) 15 MG TB24 Take 1 tablet (15 mg total) by mouth daily. 12/10/23  Yes Rice, Haig Levan, MD  Vitamin D , Ergocalciferol , (DRISDOL ) 1.25 MG (50000 UNIT) CAPS capsule Take 1 capsule (50,000 Units total) by mouth every 7 (seven) days. 01/30/24  Yes Isidro Margo, DO  EPIPEN 2-PAK 0.3 MG/0.3ML SOAJ injection  04/20/15   [provider]    Family History Family History  Problem Relation Age of Onset   Mental illness Mother     Drug abuse Mother    Diabetes Father    Alcohol abuse Father    Stroke Father    Alcohol abuse Maternal Uncle     Social History Social History   Tobacco Use   Smoking status: Former    Current packs/day: 0.00    Average packs/day: 1 pack/day for 11.0 years (11.0 ttl pk-yrs)    Types: Cigarettes    Start date: 44    Quit date: 2005    Years since quitting: 20.3    Passive exposure: Past   Smokeless tobacco: Never  Vaping Use   Vaping status: Never Used  Substance Use Topics   Alcohol use: Not Currently    Comment: rarely   Drug use: No     Allergies   Bee venom   Review of Systems Review of Systems  Respiratory:  Positive for cough.   Gastrointestinal:  Positive for vomiting.     Physical Exam Triage Vital Signs ED Triage Vitals  Encounter Vitals Group     BP 03/04/24 1709 111/79     Systolic BP Percentile --      Diastolic BP Percentile --      Pulse Rate 03/04/24 1709 74     Resp 03/04/24 1709 20     Temp 03/04/24 1709 98.5 F (36.9 C)     Temp Source 03/04/24 1709 Oral     SpO2 03/04/24 1709 94 %     Weight --      Height --      Head Circumference --      Peak Flow --      Pain Score 03/04/24 1710 2     Pain Loc --      Pain Education --      Exclude from Growth Chart --    No data found.  Updated Vital Signs BP 111/79 (BP Location: Left Arm)   Pulse 74   Temp 98.5 F (36.9 C) (Oral)   Resp 20   SpO2 94%   Visual Acuity Right Eye Distance:   Left Eye Distance:   Bilateral Distance:    Right Eye Near:   Left Eye Near:    Bilateral Near:     Physical Exam Vitals reviewed.  Constitutional:      Appearance: He is ill-appearing. He is not toxic-appearing or diaphoretic.  HENT:     Head: Normocephalic and atraumatic.     Nose: Congestion and rhinorrhea present.     Mouth/Throat:     Pharynx: Uvula midline.  Eyes:     Extraocular Movements: Extraocular movements intact.  Cardiovascular:     Rate and Rhythm: Normal rate and  regular rhythm.  Pulmonary:     Effort: Tachypnea present.     Breath sounds: Decreased air movement present. Wheezing and rhonchi present.  Musculoskeletal:        General: Normal range of motion.  Cervical back: Normal range of motion and neck supple.  Lymphadenopathy:     Cervical: No cervical adenopathy.  Skin:    General: Skin is warm and dry.     Capillary Refill: Capillary refill takes less than 2 seconds.  Neurological:     General: No focal deficit present.     Mental Status: He is alert and oriented to person, place, and time.      UC Treatments / Results  Labs (all labs ordered are listed, but only abnormal results are displayed) Labs Reviewed - No data to display  EKG   Radiology No results found.  Procedures Procedures (including critical care time)  Medications Ordered in UC Medications  albuterol (VENTOLIN HFA) 108 (90 Base) MCG/ACT inhaler 2 puff (has no administration in time range)  ipratropium-albuterol (DUONEB) 0.5-2.5 (3) MG/3ML nebulizer solution 3 mL (3 mLs Nebulization Given 03/04/24 1758)    Initial Impression / Assessment and Plan / UC Course  I have reviewed the triage vital signs and the nursing notes.  Pertinent labs & imaging results that were available during my care of the patient were reviewed by me and considered in my medical decision making (see chart for details).   Given minimal improvement with antibiotic and prednisone  therapy and reoccurring of worsening of symptoms suspect  reactive airway disease as a source of his symptoms likely related to outdoor seasonal allergies. Given immunosuppression and the inability of having x-ray onsite today I do want to cover with a stronger antibiotic to ensure there is no underlying bacterial allergies to worsening of shortness of breath and diffuse wheezing.  Will cover with cefdinir twice daily for 10 days.  Sent on another prednisone  burst with 60 mg for total of 2 days tapering down to 40 mg  for 3 days.  Given severity wheezing patient received a nebulizer treatment here in clinic will continue home management with albuterol 2 puffs every 4 hours as needed. Referral placed for pulmonology for further workup and evaluation of current symptoms given that they are impacting his ability to perform his job due to the worsening of shortness of breath and coughing. Also start patient on Xyzal advised to take nightly at bedtime to reduce irritants related to outdoor allergies.  Promethazine DM for cough.  Follow-up with pulmonology for further workup and evaluation. Final diagnoses:  Severe persistent reactive airway disease with acute exacerbation  At risk for infection due to immunosuppression  SOB (shortness of breath)     Discharge Instructions      Given your lung sounds and the fact that this has been progressing since the seasonal change I do suspect this is reactive airway disease related to outdoor allergens.  However given that you are on an immune suppressant drug I am treating you with an antibiotic again to cover for any underlying infection.  Place you back on the prednisone  as directed this should help with inflammation.  Also prescribed you Levocetirizine which will like you to take nightly this is an antihistamine hopefully that will prevent you from experiencing severe related to pollen.  Placed a referral for you to be evaluated by asthma allergy specialist office will contact you directly     ED Prescriptions     Medication Sig Dispense Auth. Provider   predniSONE  (DELTASONE ) 20 MG tablet Take 3 tablets (60 mg total) by mouth daily with breakfast for 2 days, THEN 2 tablets (40 mg total) daily with breakfast for 3 days. 12 tablet Buena Carmine, NP  cefdinir (OMNICEF) 300 MG capsule Take 1 capsule (300 mg total) by mouth 2 (two) times daily for 10 days. 20 capsule Buena Carmine, NP   albuterol (VENTOLIN HFA) 108 (90 Base) MCG/ACT inhaler Inhale 2 puffs into  the lungs every 4 (four) hours as needed for wheezing or shortness of breath. -- Buena Carmine, NP   promethazine-dextromethorphan (PROMETHAZINE-DM) 6.25-15 MG/5ML syrup Take 5 mLs by mouth 4 (four) times daily as needed for cough. 180 mL Buena Carmine, NP   levocetirizine (XYZAL) 5 MG tablet Take 1 tablet (5 mg total) by mouth every evening. 90 tablet Buena Carmine, NP      PDMP not reviewed this encounter.   Buena Carmine, NP 03/04/24 1816

## 2024-03-04 NOTE — ED Triage Notes (Addendum)
 Pt reports cough and sore throat that still have not fully resolved since 4/10 urgent care visit. Has competed all of the recommended med regimen. Recently added dayquil once he finished medications. Notes nightly emesis episodes x1 week. (Sounds more like hacking up mucus all at once prior to bedtime). Pt reports 1 episode daily. Denies fevers during this period.

## 2024-03-04 NOTE — Discharge Instructions (Addendum)
 Given your lung sounds and the fact that this has been progressing since the seasonal change I do suspect this is reactive airway disease related to outdoor allergens.  However given that you are on an immune suppressant drug I am treating you with an antibiotic again to cover for any underlying infection.  Place you back on the prednisone  as directed this should help with inflammation.  Also prescribed you Levocetirizine which will like you to take nightly this is an antihistamine hopefully that will prevent you from experiencing severe related to pollen.  Placed a referral for you to be evaluated by asthma allergy specialist office will contact you directly

## 2024-03-13 NOTE — Progress Notes (Signed)
 [facility] [address] [place] [address] Phone: [phone] Subjective:   Delwyn Filippo, am serving as a scribe for Dr. Ronnell Coins.  I'm seeing this patient by the request  of:  Laneta Pintos, MD  CC: Follow-up for pain  [id number]  01/29/2024 Left mid back after patient's visit.  Significantly low vitamin D .  Sending once weekly vitamin D  prescription.     Seeing rheumatology.  Getting treated appropriately.  Definitely something else that is contributing but will do other lab work to make sure nothing else is potentially causing a multifactorial problem.  Patient is in agreement with the plan.    Concerned that the hyperuricemia is causing more the discomfort. Do not see a true synovitis of any of the joints on ultrasound but I do see some uric acid deposits in the acromioclavicular, glenohumeral, as well as the right knee. Will get laboratory workup to further evaluate but do think there is a possibility we should consider the possibility of treatment. Discussed over-the-counter natural supplementation as well. May need to consider potential injections in the long run. Encouraged potential diet changes but did not go into great detail at the moment. Will follow-up with me again in 6 to 8 weeks no matter what but will await some of the lab test to see if anything else is contributing.   Updated 03/16/2024 Adebayo Ensminger is a 43 y.o. male coming in with complaint of polyarthralgia. Patient states that he is doing better. Stopped taking all supplements one week ago due to congestion, runny nose and coughing. Seen in UC twice. Joints are feeling better since last visit.    Patient was found to have low B12 (recommended this as well as DHEA.  Patient also had significantly low vitamin D  and was to start supplementation.    Past Medical History:  Diagnosis Date   GERD (gastroesophageal reflux disease)    Hyperlipidemia    Inflammatory  polyarthritis (HCC)    Past Surgical History:  Procedure Laterality Date   KNEE SURGERY Right    TOE SURGERY Left    VASECTOMY     Social History   Socioeconomic History   Marital status: Married    Spouse name: Not on file   Number of children: Not on file   Years of education: Not on file   Highest education level: Not on file  Occupational History   Not on file  Tobacco Use   Smoking status: Former    Current packs/day: 0.00    Average packs/day: 1 pack/day for 11.0 years (11.0 ttl pk-yrs)    Types: Cigarettes    Start date: 24    Quit date: 2005    Years since quitting: 20.3    Passive exposure: Past   Smokeless tobacco: Never  Vaping Use   Vaping status: Never Used  Substance and Sexual Activity   Alcohol use: Not Currently    Comment: rarely   Drug use: No   Sexual activity: Yes  Other Topics Concern   Not on file  Social History Narrative   Not on file   Social Drivers of Health   Financial Resource Strain: Not on file  Food Insecurity: Not on file  Transportation Needs: Not on file  Physical Activity: Not on file  Stress: Not on file  Social Connections: Not on file   Allergies  Allergen Reactions   Bee Venom    Family History  Problem Relation Age of Onset  Mental illness Mother    Drug abuse Mother    Diabetes Father    Alcohol abuse Father    Stroke Father    Alcohol abuse Maternal Uncle      Current Outpatient Medications (Cardiovascular):    EPIPEN 2-PAK 0.3 MG/0.3ML SOAJ injection,   Current Outpatient Medications (Respiratory):    albuterol  (VENTOLIN  HFA) 108 (90 Base) MCG/ACT inhaler, Inhale 2 puffs into the lungs every 4 (four) hours as needed for wheezing or shortness of breath.   benzonatate  (TESSALON ) 200 MG capsule, Take 1 capsule (200 mg total) by mouth 3 (three) times daily as needed for cough.   levocetirizine (XYZAL ) 5 MG tablet, Take 1 tablet (5 mg total) by mouth every evening.   promethazine -dextromethorphan  (PROMETHAZINE -DM) 6.25-15 MG/5ML syrup, Take 5 mLs by mouth 4 (four) times daily as needed for cough.  Current Outpatient Medications (Analgesics):    ibuprofen (ADVIL) 200 MG tablet, Take 800 mg by mouth as needed.   Upadacitinib  ER (RINVOQ ) 15 MG TB24, Take 1 tablet (15 mg total) by mouth daily.   Current Outpatient Medications (Other):    Vitamin D , Ergocalciferol , (DRISDOL ) 1.25 MG (50000 UNIT) CAPS capsule, Take 1 capsule (50,000 Units total) by mouth every 7 (seven) days.   Reviewed prior external information including notes and imaging from  primary care provider As well as notes that were available from care everywhere and other healthcare systems.  Past medical history, social, surgical and family history all reviewed in electronic medical record.  No pertanent information unless stated regarding to the chief complaint.   Review of Systems:  No headache, visual changes, nausea, vomiting, diarrhea, constipation, dizziness, abdominal pain, skin rash, fevers, chills, night sweats, weight loss, swollen lymph nodes, body aches, joint swelling, chest pain, shortness of breath, mood changes. POSITIVE muscle aches  Objective  Blood pressure 122/75, height 6\' 1"  (1.854 m), weight (!) 309 lb (140.2 kg), SpO2 97%.   General: No apparent distress alert and oriented x3 mood and affect normal, dressed appropriately.  HEENT: Pupils equal, extraocular movements intact  Respiratory: Patient's speak in full sentences and does not appear short of breath  Cardiovascular: No lower extremity edema, non tender, no erythema  Sitting comfortably, does not seem to be in pain.  Able to get up from a sitting position without any significant difficulty.  Minimal discomfort over the lateral epicondylar area.    Impression and Recommendations:    The above documentation has been reviewed and is accurate and complete Laisa Larrick M Agnes Probert, DO

## 2024-03-16 ENCOUNTER — Ambulatory Visit: Admitting: Family Medicine

## 2024-03-16 VITALS — BP 122/75 | Ht 73.0 in | Wt 309.0 lb

## 2024-03-16 DIAGNOSIS — E790 Hyperuricemia without signs of inflammatory arthritis and tophaceous disease: Secondary | ICD-10-CM

## 2024-03-16 DIAGNOSIS — E559 Vitamin D deficiency, unspecified: Secondary | ICD-10-CM | POA: Diagnosis not present

## 2024-03-16 DIAGNOSIS — M0600 Rheumatoid arthritis without rheumatoid factor, unspecified site: Secondary | ICD-10-CM

## 2024-03-16 NOTE — Patient Instructions (Signed)
 Happy you are doing well See me in 2 months

## 2024-03-17 NOTE — Assessment & Plan Note (Signed)
 Also taking the tart cherry extract.  No change.

## 2024-03-17 NOTE — Assessment & Plan Note (Signed)
 Continue with current therapy, seems to be making significant improvement.

## 2024-03-17 NOTE — Assessment & Plan Note (Signed)
 Continue supplementation, seems to be making a significant improvement.

## 2024-03-19 ENCOUNTER — Other Ambulatory Visit (HOSPITAL_COMMUNITY): Payer: Self-pay

## 2024-03-24 ENCOUNTER — Other Ambulatory Visit: Payer: Self-pay

## 2024-03-26 ENCOUNTER — Other Ambulatory Visit: Payer: Self-pay

## 2024-03-26 ENCOUNTER — Other Ambulatory Visit: Payer: Self-pay | Admitting: Internal Medicine

## 2024-03-26 ENCOUNTER — Other Ambulatory Visit (HOSPITAL_COMMUNITY): Payer: Self-pay

## 2024-03-26 DIAGNOSIS — M0600 Rheumatoid arthritis without rheumatoid factor, unspecified site: Secondary | ICD-10-CM

## 2024-03-26 DIAGNOSIS — Z79899 Other long term (current) drug therapy: Secondary | ICD-10-CM

## 2024-03-26 MED ORDER — RINVOQ 15 MG PO TB24
15.0000 mg | ORAL_TABLET | Freq: Every day | ORAL | 0 refills | Status: DC
Start: 2024-03-26 — End: 2024-04-21
  Filled 2024-03-26: qty 30, 30d supply, fill #0

## 2024-03-26 NOTE — Progress Notes (Signed)
 Specialty Pharmacy Refill Coordination Note  Thomas Schaefer is a 43 y.o. male contacted today regarding refills of specialty medication(s) Upadacitinib  (Rinvoq )   Patient requested Delivery   Delivery date: 04/06/24   Verified address: [address] [place], [place] [address]   Medication will be filled on 04/03/24.

## 2024-03-26 NOTE — Telephone Encounter (Signed)
 Last Fill: 12/10/2023  Labs: 11/30/2023 CBC WNL BUN 8 Albumin 3.9  LMOM for patient to update labs .   TB Gold: 12/03/2022 Negative   Next Visit: 04/21/2024  Last Visit: 12/10/2023  [id number] rheumatoid arthritis   Current Dose per office note 12/10/2023: Rinvoq  15 mg daily   Okay to refill Rinvoq ?

## 2024-04-03 ENCOUNTER — Other Ambulatory Visit: Payer: Self-pay

## 2024-04-08 NOTE — Progress Notes (Signed)
 Office Visit Note  Patient: Thomas Schaefer             Date of Birth: 1981/02/17           MRN: [id number]             PCP: Chandra Toribio POUR, MD Referring: Chandra Toribio POUR, MD Visit Date: 04/21/2024   Subjective:  Follow-up    Discussed the use of AI scribe software for clinical note transcription with the patient, who gave verbal consent to proceed.  History of Present Illness   Thomas Schaefer is a 43 y.o. male here for follow up for seropositive RA on Rinvoq  15 mg p.o. daily.    He experiences ongoing joint pain and stiffness, primarily affecting his wrists, shoulders, ankles, and knees. Rinvoq  has been more effective than previous treatments, particularly in his hands, but significant discomfort persists in his knees, especially at night. He describes a new pain radiating from his back down the side of his leg, suspecting it may be related to his sciatic nerve.  He recently completed a prednisone  taper following bronchitis and an unspecified infection, which temporarily alleviated his joint pain. However, the pain returned shortly after discontinuing prednisone .  He has low vitamin D  levels and is taking supplements. Additionally, he is taking DHEA due to lower than normal testosterone  levels identified in recent blood work.  There is a history of elevated uric acid levels, and he recalls a previous episode of foot pain that responded well to medication, suggesting a possible gout diagnosis. More recently, in sports medicine with Dr. Claudene clinic an ultrasound apparently showed uric acid crystals in his joints. He recalls taking colchicine  in the past, which provided relief during a gout flare-up. He is uncertain about previous use of allopurinol , but notes that his uric acid levels have been high in the past, though they were within normal range during a recent check in April.  Previous HPI 12/10/2023 Thomas Schaefer is a 43 y.o. male here for follow up for seropositive RA on  Rinvoq  15 mg p.o. daily.   He is managing rheumatoid arthritis with Rinvoq  after discontinuing prednisone . Rinvoq  has effectively reduced pain in his hand joints, allowing him to close his fist without pain, though his fingers remain swollen, preventing him from wearing his ring. 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 The pain subsides after the first 10 to 20 steps.   He experiences sleep disturbances due to shoulder pain and restless legs. He describes numbness and a restless sensation in his shoulders when lying down, necessitating frequent position changes. His restless leg symptoms have worsened, characterized by an uncomfortable sensation that compels movement for relief. He has not previously taken medication for restless legs and denies any history of iron deficiency.   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 The results were normal except for slightly elevated cholesterol levels. No recent illnesses have been reported.      Previous HPI 09/04/2023 Thomas Schaefer is a 43 y.o. male here for follow up for seropositive RA on Rinvoq  15 mg p.o. daily and prednisone  5 mg twice daily.  Since witching to Rinvoq  he is overall mostly doing better.  Most of his joint pain and stiffness outside of the right knee is controlled with just the Rinvoq .  He does notice symptoms creeping in a bit worse by the end of the day.  He decreased the prednisone  now taking just 5 mg once daily if he stops this for 2 or 3 days he redeveloped significant knee swelling.  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   Previous  HPI 06/04/2023 Thomas Schaefer is a 43 y.o. male here for follow up for seropositive RA on Enbrel  50 mg subcu weekly and prednisone  5 mg twice daily.  He has tried decreasing prednisone  to only once daily on some days but when stopping completely he quickly saw increase in joint pain and swelling within days.  He has a stable amount of persistent right knee swelling.  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  After the blistering at the sting site drained swelling has mostly gone down except in his left index finger.  We spoke on the phone last month due to increasing trouble with pain in his shoulders with some numbness tingling and sometimes painful sensation radiating down the arms.  Especially has trouble with shoulder pain at night if he lies on either side which frequently disrupts his sleep.  Recommended neurology evaluation for nerve conduction study but he missed this appointment due to being sick with COVID and needs to reschedule.   Previous HPI 03/06/2023 Thomas Schaefer is a 43 y.o. male here for follow up for seronegative RA on Enbrel  now for 1 month and on prednisone  5 mg twice daily.  He is not noticing trouble with the Enbrel  but also has not seen a significant improvement in joint symptoms.  Symptoms are mostly controlled with 5 mg twice daily prednisone  still having swelling in his hands and wrists and bilateral shoulder pain.  Shoulder pain is most bothersome at nighttime and frequently awakens him from sleep requiring repositioning.  Also gets pain during the day most with overhead movements.  When decreasing the prednisone  further he notices some right-sided pain at the lower jaw and ear.  He has had pain in this area in multiple occasions on the past without specific cause identified that usually has resolved very quickly with antibiotic treatment.    Previous HPI 01/23/23 Thomas Schaefer is a 43 y.o. male here for follow up for seropositiverheumatoid arthritis on leflunomide  20 mg daily.  Since last visit he had right knee arthroscopic surgery with some debridement of extensive synovial hypertrophy and some loose cartilage bodies.  Afterwards he discontinued prednisone  completely and after about 4 days started feeling generalized worsening of joint pain and stiffness and severe fatigue.  Initially tried managing symptoms with ibuprofen 800 mg after calling us  I recommended resuming a lower dose prednisone  and gradually tapering it off at 2-week increment.  So he is back to taking 5 mg twice daily right now and ibuprofen 800 mg every 6 hours and has been out of work for the past 2 weeks.  He is not having trouble taking the leflunomide  but has not seen any improvement yet.  His right knee actually feels well and everywhere else is more painful and inflamed.  Has bilateral hand swelling.  Notices weakness inability to grip and lift strongly with his arms.  Also bilateral  shoulder pain especially with overhead abduction and lying on his side in bed.   3/11 Right knee surgical pathology Chronically inflamed synovium with degenerative changes   Previous HPI 12/31/22 Thomas Schaefer is a 43 y.o. male here for follow up for RA and gout currently on prednisone  10 mg daily.  Right now he is in a symptom exacerbation doing much worse had a flareup after an exercise training session last week.  He is having pain in his right knee which has been the main ongoing issue also having more trouble with his shoulders right now with pain worse posteriorly especially feels increased pain when lying on his side in bed.  He has had some continued weight gain with the long-term prednisone  use starting phentermine  for weight loss has not seen any progress so far.  He is scheduled for right knee surgery coming up in 1 week.   Previous HPI 08/27/22 Thomas Schaefer is a 43  y.o. male here for follow up for RA and gout currently on prednisone  10 mg daily. He stopped taking methotrexate  due to running out of prescribed tablets after about 1 month. He saw [facility] for follow up with repeat aspiration and injection in right knee and symptoms stayed better for a month or more before swelling and pain got worse again. No new flare ups anywhere else. He discussed possibility of arthroscopy early next year due to the chronic symptoms.   Previous HPI 06/04/22 Thomas Schaefer is a 43 y.o. male here for follow up joint inflammation. Since our last visit he saw Dr. Kendal and MRI of right knee obtained showing changes of synovitis and probable ruptured baker's cyst. He has had a steroid injection of the left knee and the left leg is doing well. Hand pain and stiffness are also doing very well mostly just has the knee pain. He started methotrexate  since 2 weeks ago tolerating it okay. He decreased prednisone  down to 10 mg once daily from twice for a week and is tolerating this okay his knee swelling increased some but not very painful.   Previous HPI 03/30/2022 Thomas Schaefer is a 43 y.o. male here for follow up for joint inflammation. Since our last visit he completed the prednisone  his pain was partially improved on this but quickly returned off the medication. Swelling never resolved. He now has increased symptoms involving his hands with decreased grip strength and mobility. He notices increasing pain in left knee and right ankle he thinks is compensating for his worse affected joints.   Previous HPI 03/14/2022 Thomas Schaefer is a 43 y.o. male here for follow up for joint inflammation.  Shortly after completing most recent steroids he has redeveloped increased swelling of the right knee and swelling and pain in the left ankle.  He noticed the most recent steroid treatment improved his swelling but the left ankle pain was not dramatically improved. So far he has not seen an  obvious difference in swelling while taking the sulfasalazine .   Previous HPI 02/27/2022 Thomas Schaefer is a 43 y.o. male here for follow up for joint inflammation. His knee improved greatly after aspiration and with prednisone  taper but since 4 days ago left ankle pain and inflammation started. Pain is worst first thing in the morning and when standing up gets partially better when up and walking around. He is not taking anything for this currently    Previous HPI 02/07/22 Thomas Schaefer is a 43 y.o. male here for follow up for joint inflammation previously seen  in October initial plan to treat gouty arthritis not clear if also having RA with a positive RF. He has suffered multiple and persistent joint inflammation treated with colchicine  and multiple prednisone  courses. He started taking allopurinol  last month and within days developed severe pain and stiffness in his right knee and some pain extending up to the right hip posteriorly. Pain is intermittent and stabbing, and he cannot fully extend his knee. Symptoms are not similar to anything that he had in the past. He took 2 12 day prednisone  tapers these improved symptoms but came back again once down to about 20 mg daily dose. He stopped taking colchicine  in accordance with directions when prescribed the prednisone .   Previous HPI 08/21/21 Thomas Schaefer is a 43 y.o. male here for foot pain and inflammation with elevated rheumatoid factor and uric acid.  Symptoms started since around July of this year with pain on the right foot worse at the bottom of the foot and the smaller for toes. Initial treatment with colchicine  had partial benefit in symptoms.  There is also considerable initial swelling.  He received a short course of oral steroids with more symptom improvement but this persisted. He saw Dr. Verta in September treatment with local steroid infection and oral prednisone  taper and blood tests were checked.  Inflammatory markers were normal but  rheumatoid factor and uric acid were significantly elevated.  Xray of the foot has shown soft tissue edema without significant osseus abnormalities.  After the injection and additional medication symptoms again improved at this point he is having persistent mild daily symptoms with some pain worsened with pressure and weight on the ball of his foot without much active swelling. He has had some chronic pain and instability of the right knee for a much longer time without any identified problem, xrays have been normal.  He does not drink much alcohol regularly.  He used to drink sodas very heavily but has already decreased this to regular but small amounts before any onset of the symptoms.   Labs reviewed 06/2021 ANA neg RF 125 Uric acid 11.1 ESR 6 CRP 3.0   Review of Systems  Constitutional:  Negative for fatigue.  HENT:  Negative for mouth sores and mouth dryness.   Eyes:  Negative for dryness.  Respiratory:  Negative for shortness of breath.   Cardiovascular:  Negative for chest pain and palpitations.  Gastrointestinal:  Negative for blood in stool, constipation and diarrhea.  Endocrine: Negative for increased urination.  Genitourinary:  Negative for involuntary urination.  Musculoskeletal:  Positive for morning stiffness. Negative for joint pain, gait problem, joint pain, joint swelling, myalgias, muscle weakness, muscle tenderness and myalgias.  Skin:  Negative for color change, rash, hair loss and sensitivity to sunlight.  Allergic/Immunologic: Negative for susceptible to infections.  Neurological:  Negative for dizziness and headaches.  Hematological:  Negative for swollen glands.  Psychiatric/Behavioral:  Negative for depressed mood and sleep disturbance. The patient is not nervous/anxious.     PMFS History:  Patient Active Problem List   Diagnosis Date Noted   Vitamin D  deficiency 01/30/2024   Impingement of shoulder 06/04/2023   Right ear pain 03/06/2023   High risk  medication use 12/31/2022   Dyslipidemia, goal LDL below 100 10/20/2022   Other fatigue 09/03/2022   Abnormal fasting glucose 09/03/2022   Polyarticular gout 03/30/2022   Seronegative rheumatoid arthritis (HCC) 01/15/2022   Body mass index (BMI) of 31.0-31.9 in adult 01/15/2022   BMI 40.0-44.9, adult (HCC) 12/03/2021  Hyperuricemia 08/21/2021   Obesity 07/13/2015   Mood disorder (HCC) 07/13/2015   Allergic rhinitis 06/06/2007   GERD 06/06/2007   IRRITABLE BOWEL SYNDROME 06/06/2007    Past Medical History:  Diagnosis Date   GERD (gastroesophageal reflux disease)    Hyperlipidemia    Inflammatory polyarthritis (HCC)     Family History  Problem Relation Age of Onset   Mental illness Mother    Drug abuse Mother    Diabetes Father    Alcohol abuse Father    Stroke Father    Alcohol abuse Maternal Uncle    Past Surgical History:  Procedure Laterality Date   KNEE SURGERY Right    TOE SURGERY Left    VASECTOMY     Social History   Social History Narrative   Not on file   Immunization History  Administered Date(s) Administered   Moderna Sars-Covid-2 Vaccination 11/04/2019, 12/03/2019     Objective: Vital Signs: BP 106/68 (BP Location: Right Arm, Patient Position: Sitting)   Pulse 60   Resp 14   Ht 6' 1 (1.854 m)   Wt (!) 316 lb (143.3 kg)   BMI 41.69 kg/m    Physical Exam Constitutional:      Appearance: He is obese.   Eyes:     Conjunctiva/sclera: Conjunctivae normal.    Cardiovascular:     Rate and Rhythm: Normal rate and regular rhythm.  Pulmonary:     Effort: Pulmonary effort is normal.     Breath sounds: Normal breath sounds.   Musculoskeletal:     Right lower leg: No edema.     Left lower leg: No edema.  Lymphadenopathy:     Cervical: No cervical adenopathy.   Skin:    General: Skin is warm and dry.     Findings: No rash.   Neurological:     Mental Status: He is alert.   Psychiatric:        Mood and Affect: Mood normal.       Musculoskeletal Exam:  Shoulders full ROM, tenderness to pressure along posterior side of joint, some pain with overhead abduction and external rotation Elbows full ROM no tenderness or swelling Left wrist soreness over first dorsal compartment, no palpable effusion Fingers full ROM no tenderness or swelling No paraspinal tenderness to palpation over upper and lower back Hip normal internal and external rotation without pain, no tenderness to lateral hip palpation Knees full ROM there is a small to moderate palpable left knee effusion no overlying warmth or erythema Ankles full ROM no tenderness or swelling   Investigation: No additional findings.  Imaging: No results found.  Recent Labs: Lab Results  Component Value Date   WBC 7.5 09/04/2023   HGB 15.3 09/04/2023   PLT 280 09/04/2023   NA 141 09/04/2023   K 3.8 09/04/2023   CL 104 09/04/2023   CO2 28 09/04/2023   GLUCOSE 61 (L) 09/04/2023   BUN 7 09/04/2023   CREATININE 1.11 09/04/2023   BILITOT 0.6 09/04/2023   ALKPHOS 79 09/03/2022   AST 28 09/04/2023   ALT 38 09/04/2023   PROT 6.9 09/04/2023   ALBUMIN 3.8 (L) 09/03/2022   CALCIUM 9.5 01/29/2024   GFRAA >60 06/24/2017    Speciality Comments: Patient had monitoring labs in 11/2023, media in [place] message  Procedures:  No procedures performed Allergies: Bee venom   Assessment / Plan:     Visit Diagnoses: Seronegative rheumatoid arthritis (HCC) - Plan: Upadacitinib  ER (RINVOQ ) 15 MG TB24 Rheumatoid arthritis with persistent  joint pain and stiffness. Rinvoq  provided partial relief. POssible that this is a flare of RA despite treatment, vs differential with gout considered. - Continue Rinvoq  15 mg daily. - Recheck labs for monitoring.  High risk medication use - Rinvoq  15 mg daily - Plan: CBC with Differential/Platelet, Comprehensive metabolic panel with GFR, Upadacitinib  ER (RINVOQ ) 15 MG TB24 Has been tolerating medication well with no reported side effect.   No serious interval infections. - Checking CBC and CMP for medication monitoring on continued Rinvoq   Impingement of shoulder Improvement compared to last visit saw Dr. Claudene for this problem.  Still has some pain in the shoulders both with movement and palpation but less radiation.  Gout Chronic gout with uric acid crystal deposits confirmed by ultrasound. Normal recent uric acid levels. Differential includes rheumatoid arthritis. Colchicine  chosen for acute flare management due to its specificity for gout-related inflammation. - Prescribe colchicine : two pills initially, then one pill twice daily for five days. - Monitor response, especially in left knee. - Reportedly did not tolerate allopurinol  well when tried in 2023 could be candidate for probenecid or febuxostat if symptom response is suggestive for crystal arthropathy  Vitamin D  deficiency Vitamin D  deficiency with low levels. Supplementation initiated to correct deficiency already by Dr. Claudene with 50,000 units weekly should be be getting close to completed. - Continue current vitamin D  supplementation for 12 weeks  Low testosterone  Low testosterone  levels with symptoms of fatigue and decreased muscle strength. DHEA recommended to naturally increase testosterone  levels without risks of direct supplementation. - Continue DHEA supplementation.        Orders: Orders Placed This Encounter  Procedures   CBC with Differential/Platelet   Comprehensive metabolic panel with GFR   Uric acid   Meds ordered this encounter  Medications   colchicine  0.6 MG tablet    Sig: Take 2 tablets on 1st day then 1 tablet daily for 5 days    Dispense:  7 tablet    Refill:  0   Upadacitinib  ER (RINVOQ ) 15 MG TB24    Sig: Take 1 tablet (15 mg total) by mouth daily.    Dispense:  90 tablet    Refill:  0    Prescription Type::   Renewal     Follow-Up Instructions: Return in about 3 months (around 07/22/2024) for RA/gout on UPA f/u  3mos.   Lonni LELON Ester, MD  Note - This record has been created using [employer].  Chart creation errors have been sought, but may not always  have been located. Such creation errors do not reflect on  the standard of medical care.

## 2024-04-21 ENCOUNTER — Encounter: Payer: Self-pay | Admitting: Internal Medicine

## 2024-04-21 ENCOUNTER — Other Ambulatory Visit: Payer: Self-pay

## 2024-04-21 ENCOUNTER — Ambulatory Visit: Attending: Internal Medicine | Admitting: Internal Medicine

## 2024-04-21 VITALS — BP 106/68 | HR 60 | Resp 14 | Ht 73.0 in | Wt 316.0 lb

## 2024-04-21 DIAGNOSIS — M109 Gout, unspecified: Secondary | ICD-10-CM

## 2024-04-21 DIAGNOSIS — M25819 Other specified joint disorders, unspecified shoulder: Secondary | ICD-10-CM | POA: Diagnosis not present

## 2024-04-21 DIAGNOSIS — Z79899 Other long term (current) drug therapy: Secondary | ICD-10-CM | POA: Diagnosis not present

## 2024-04-21 DIAGNOSIS — M0600 Rheumatoid arthritis without rheumatoid factor, unspecified site: Secondary | ICD-10-CM

## 2024-04-21 MED ORDER — COLCHICINE 0.6 MG PO TABS: ORAL_TABLET | ORAL | 0 refills | Status: DC

## 2024-04-21 MED ORDER — RINVOQ 15 MG PO TB24
15.0000 mg | ORAL_TABLET | Freq: Every day | ORAL | 0 refills | Status: DC
Start: 2024-04-21 — End: 2024-09-16
  Filled 2024-04-21: qty 90, 90d supply, fill #0
  Filled 2024-04-24 – 2024-04-27 (×2): qty 30, 30d supply, fill #0
  Filled 2024-05-28: qty 30, 30d supply, fill #1
  Filled 2024-06-30 – 2024-08-07 (×2): qty 30, 30d supply, fill #2

## 2024-04-22 LAB — COMPREHENSIVE METABOLIC PANEL WITH GFR
AG Ratio: 1.8 (calc) (ref 1.0–2.5)
ALT: 38 U/L (ref 9–46)
AST: 30 U/L (ref 10–40)
Albumin: 4.1 g/dL (ref 3.6–5.1)
Alkaline phosphatase (APISO): 65 U/L (ref 36–130)
BUN: 11 mg/dL (ref 7–25)
CO2: 26 mmol/L (ref 20–32)
Calcium: 9.2 mg/dL (ref 8.6–10.3)
Chloride: 107 mmol/L (ref 98–110)
Creat: 1.03 mg/dL (ref 0.60–1.29)
Globulin: 2.3 g/dL (ref 1.9–3.7)
Glucose, Bld: 106 mg/dL — ABNORMAL HIGH (ref 65–99)
Potassium: 4.2 mmol/L (ref 3.5–5.3)
Sodium: 141 mmol/L (ref 135–146)
Total Bilirubin: 0.5 mg/dL (ref 0.2–1.2)
Total Protein: 6.4 g/dL (ref 6.1–8.1)
eGFR: 93 mL/min/{1.73_m2} (ref >=60)

## 2024-04-22 LAB — CBC WITH DIFFERENTIAL/PLATELET
Absolute Lymphocytes: 1693 {cells}/uL (ref 850–3900)
Absolute Monocytes: 342 {cells}/uL (ref 200–950)
Basophils Absolute: 53 {cells}/uL (ref 0–200)
Basophils Relative: 0.9 %
Eosinophils Absolute: 224 {cells}/uL (ref 15–500)
Eosinophils Relative: 3.8 %
HCT: 42.9 % (ref 38.5–50.0)
Hemoglobin: 14.2 g/dL (ref 13.2–17.1)
MCH: 29.5 pg (ref 27.0–33.0)
MCHC: 33.1 g/dL (ref 32.0–36.0)
MCV: 89 fL (ref 80.0–100.0)
MPV: 10.1 fL (ref 7.5–12.5)
Monocytes Relative: 5.8 %
Neutro Abs: 3587 {cells}/uL (ref 1500–7800)
Neutrophils Relative %: 60.8 %
Platelets: 230 10*3/uL (ref 140–400)
RBC: 4.82 10*6/uL (ref 4.20–5.80)
RDW: 13.1 % (ref 11.0–15.0)
Total Lymphocyte: 28.7 %
WBC: 5.9 10*3/uL (ref 3.8–10.8)

## 2024-04-22 LAB — URIC ACID: Uric Acid, Serum: 7.2 mg/dL (ref 4.0–8.0)

## 2024-04-24 ENCOUNTER — Other Ambulatory Visit: Payer: Self-pay

## 2024-04-27 ENCOUNTER — Other Ambulatory Visit: Payer: Self-pay

## 2024-04-27 NOTE — Progress Notes (Signed)
 Specialty Pharmacy Refill Coordination Note  Thomas Schaefer is a 43 y.o. male contacted today regarding refills of specialty medication(s) Upadacitinib  (Rinvoq )   Patient requested Delivery   Delivery date: 04/29/24   Verified address: [address] [place], [place] [address]   Medication will be filled on 07.01.25.

## 2024-05-06 ENCOUNTER — Other Ambulatory Visit: Payer: Self-pay

## 2024-05-19 NOTE — Progress Notes (Unsigned)
 [facility] [address] [place] [address] Phone: [phone] Subjective:   Thomas Schaefer, am serving as a scribe for Dr. Arthea Claudene.  I'm seeing this patient by the request  of:  Chandra Toribio POUR, MD  CC: multiple complaints   YEP:Dlagzrupcz  03/16/2024 Also taking the tart cherry extract.  No change.     Continue supplementation, seems to be making a significant improvement.     Continue with current therapy, seems to be making significant improvement.      Update 05/20/2024 Thomas Schaefer is a 43 y.o. male coming in with complaint of rheumatoid arthritis. Patient states that he is the same as last visit. Pain in the knee and ankle persist. Vitamins did not do much for his pain. Was on prednisone  last visit and he did not have pain at that time. Two days after finishing the course his pain returned.   Seen in his otologist recently.  On RINVOQ  also discussed about the chronic gout. Patient did have repeat uric acid levels but still elevated at 7.2.     Past Medical History:  Diagnosis Date   GERD (gastroesophageal reflux disease)    Hyperlipidemia    Inflammatory polyarthritis (HCC)    Past Surgical History:  Procedure Laterality Date   KNEE SURGERY Right    TOE SURGERY Left    VASECTOMY     Social History   Socioeconomic History   Marital status: Married    Spouse name: Not on file   Number of children: Not on file   Years of education: Not on file   Highest education level: Not on file  Occupational History   Not on file  Tobacco Use   Smoking status: Former    Current packs/day: 0.00    Average packs/day: 1 pack/day for 11.0 years (11.0 ttl pk-yrs)    Types: Cigarettes    Start date: 78    Quit date: 2005    Years since quitting: 20.5    Passive exposure: Past   Smokeless tobacco: Never  Vaping Use   Vaping status: Never Used  Substance and Sexual Activity   Alcohol use: Not Currently    Comment:  rarely   Drug use: No   Sexual activity: Yes  Other Topics Concern   Not on file  Social History Narrative   Not on file   Social Drivers of Health   Financial Resource Strain: Not on file  Food Insecurity: Not on file  Transportation Needs: Not on file  Physical Activity: Not on file  Stress: Not on file  Social Connections: Not on file   Allergies  Allergen Reactions   Bee Venom    Family History  Problem Relation Age of Onset   Mental illness Mother    Drug abuse Mother    Diabetes Father    Alcohol abuse Father    Stroke Father    Alcohol abuse Maternal Uncle     Current Outpatient Medications (Endocrine & Metabolic):    predniSONE  (DELTASONE ) 20 MG tablet, Take 1 tablet (20 mg total) by mouth daily with breakfast.  Current Outpatient Medications (Cardiovascular):    EPIPEN 2-PAK 0.3 MG/0.3ML SOAJ injection,   Current Outpatient Medications (Respiratory):    albuterol  (VENTOLIN  HFA) 108 (90 Base) MCG/ACT inhaler, Inhale 2 puffs into the lungs every 4 (four) hours as needed for wheezing or shortness of breath.   benzonatate  (TESSALON ) 200 MG capsule, Take 1 capsule (200 mg total) by mouth  3 (three) times daily as needed for cough.   levocetirizine (XYZAL ) 5 MG tablet, Take 1 tablet (5 mg total) by mouth every evening.   promethazine -dextromethorphan (PROMETHAZINE -DM) 6.25-15 MG/5ML syrup, Take 5 mLs by mouth 4 (four) times daily as needed for cough.  Current Outpatient Medications (Analgesics):    colchicine  0.6 MG tablet, Take 2 tablets on 1st day then 1 tablet daily for 5 days   febuxostat  (ULORIC ) 40 MG tablet, Take 2 tablets (80 mg total) by mouth daily.   ibuprofen (ADVIL) 200 MG tablet, Take 800 mg by mouth as needed.   Upadacitinib  ER (RINVOQ ) 15 MG TB24, Take 1 tablet (15 mg total) by mouth daily.   Current Outpatient Medications (Other):    Vitamin D , Ergocalciferol , (DRISDOL ) 1.25 MG (50000 UNIT) CAPS capsule, Take 1 capsule (50,000 Units total) by  mouth every 7 (seven) days.   Reviewed prior external information including notes and imaging from  primary care provider As well as notes that were available from care everywhere and other healthcare systems.  Past medical history, social, surgical and family history all reviewed in electronic medical record.  No pertanent information unless stated regarding to the chief complaint.   Review of Systems:  No headache, visual changes, nausea, vomiting, diarrhea, constipation, dizziness, abdominal pain, skin rash, fevers, chills, night sweats, weight loss, swollen lymph nodes, chest pain, shortness of breath, mood changes. POSITIVE muscle aches, joint swelling, body aches  Objective  Blood pressure (!) 122/92, pulse 65, height 6' 1 (1.854 m), weight (!) 312 lb (141.5 kg), SpO2 96%.   General: No apparent distress alert and oriented x3 mood and affect normal, dressed appropriately.  HEENT: Pupils equal, extraocular movements intact  Respiratory: Patient's speak in full sentences and does not appear short of breath   Does have swelling of multiple joints including the hands.  Knees have trace effusion noted over the patellofemoral joint.  Neurovascular intact distally but does have very mild pitting edema of the lower extremities mild weakness with grip strength.  Positive FABER test on the left side.  Negative straight leg test noted today.    Impression and Recommendations:     The above documentation has been reviewed and is accurate and complete Thomas Santoli M Loyalty Arentz, DO

## 2024-05-20 ENCOUNTER — Ambulatory Visit: Admitting: Family Medicine

## 2024-05-20 VITALS — BP 122/92 | HR 65 | Ht 73.0 in | Wt 312.0 lb

## 2024-05-20 DIAGNOSIS — M109 Gout, unspecified: Secondary | ICD-10-CM

## 2024-05-20 DIAGNOSIS — G5702 Lesion of sciatic nerve, left lower limb: Secondary | ICD-10-CM | POA: Diagnosis not present

## 2024-05-20 DIAGNOSIS — M0600 Rheumatoid arthritis without rheumatoid factor, unspecified site: Secondary | ICD-10-CM

## 2024-05-20 DIAGNOSIS — L918 Other hypertrophic disorders of the skin: Secondary | ICD-10-CM | POA: Diagnosis not present

## 2024-05-20 DIAGNOSIS — D485 Neoplasm of uncertain behavior of skin: Secondary | ICD-10-CM | POA: Diagnosis not present

## 2024-05-20 DIAGNOSIS — E790 Hyperuricemia without signs of inflammatory arthritis and tophaceous disease: Secondary | ICD-10-CM | POA: Diagnosis not present

## 2024-05-20 MED ORDER — PREDNISONE 20 MG PO TABS: 20.0000 mg | ORAL_TABLET | Freq: Every day | ORAL | 0 refills | Status: AC

## 2024-05-20 MED ORDER — FEBUXOSTAT 40 MG PO TABS: 80.0000 mg | ORAL_TABLET | Freq: Every day | ORAL | 0 refills | Status: DC

## 2024-05-20 NOTE — Assessment & Plan Note (Signed)
Using Netter's Orthopaedic Anatomy, reviewed with the patient the structures involved and how they related to diagnosis. The patient indicated understanding.   The patient was given a handout about classic piriformis stretching including Pigeon Pose, Modified Pigeon Pose, my self-described "Sink Stretch," and other piriformis rehab.  We also reviewed hip flexor and abductor strengthening, ham stretching  Rec deep massage, explained self-massage with ball  

## 2024-05-20 NOTE — Patient Instructions (Addendum)
 Uloric  80mg  once dialy 5 days of prednisone  20mg   Piriformis exercises See me again in 2 months

## 2024-05-20 NOTE — Assessment & Plan Note (Signed)
 Continues to have elevated uric acid.  Did come down while patient was on prednisone .  Still concerning in my opinion.  Do think that a seronegative rheumatoid arthritis could also be contributing.  Started on Uloric .  Encouraged him to follow-up with rheumatologist as well.  Will recheck uric acid in 8 weeks after starting it.  Warned of potential side effects.  Follow-up with me again in 2 to 3 months

## 2024-05-28 ENCOUNTER — Other Ambulatory Visit: Payer: Self-pay

## 2024-06-01 ENCOUNTER — Other Ambulatory Visit: Payer: Self-pay

## 2024-06-01 NOTE — Progress Notes (Signed)
 Specialty Pharmacy Refill Coordination Note  Thomas Schaefer is a 43 y.o. male contacted today regarding refills of specialty medication(s) Upadacitinib  (Rinvoq )   Patient requested Delivery   Delivery date: 06/05/24   Verified address: [address] [place], [place] [address]   Medication will be filled on 08.07.25.

## 2024-06-02 ENCOUNTER — Other Ambulatory Visit: Payer: Self-pay

## 2024-06-02 NOTE — Progress Notes (Signed)
 Clinical Intervention Note  Clinical Intervention Notes: Patient reported starting febuxostat . No DDIs identified with Rinvoq .   Clinical Intervention Outcomes: Prevention of an adverse drug event   Advertising account planner

## 2024-06-03 ENCOUNTER — Other Ambulatory Visit: Payer: Self-pay

## 2024-06-08 ENCOUNTER — Telehealth: Payer: Self-pay | Admitting: Pharmacist

## 2024-06-08 NOTE — Telephone Encounter (Signed)
 Submitted a Prior Authorization RENEWAL request to [facility] for RINVOQ  via CoverMyMeds. Will update once we receive a response.  Key: [id number]

## 2024-06-09 NOTE — Telephone Encounter (Signed)
 Received notification from [facility] regarding a prior authorization for RINVOQ . Authorization has been APPROVED from 06/08/2024 to 06/08/2025. Approval letter sent to scan center.  Authorization # [id number]  Sherry Pennant, PharmD, MPH, BCPS, CPP Clinical Pharmacist (Rheumatology and Pulmonology)

## 2024-06-25 ENCOUNTER — Encounter: Payer: Self-pay | Admitting: Family Medicine

## 2024-06-26 ENCOUNTER — Other Ambulatory Visit: Payer: Self-pay

## 2024-06-26 MED ORDER — FEBUXOSTAT 40 MG PO TABS: 40.0000 mg | ORAL_TABLET | Freq: Every day | ORAL | 0 refills | Status: DC

## 2024-06-30 ENCOUNTER — Other Ambulatory Visit (HOSPITAL_COMMUNITY): Payer: Self-pay

## 2024-07-02 ENCOUNTER — Other Ambulatory Visit (HOSPITAL_COMMUNITY): Payer: Self-pay

## 2024-07-02 DIAGNOSIS — L700 Acne vulgaris: Secondary | ICD-10-CM | POA: Diagnosis not present

## 2024-07-08 NOTE — Progress Notes (Deleted)
 Office Visit Note  Patient: Thomas Schaefer             Date of Birth: [birth year]           MRN: [id number]             PCP: Chandra Toribio POUR, MD Referring: Chandra Toribio POUR, MD Visit Date: 07/22/2024   Subjective:  No chief complaint on file.   History of Present Illness: Thomas Schaefer is a 43 y.o. male here for follow up for seropositive RA on Rinvoq  15 mg p.o. daily.   Previous HPI 04/21/2024 Thomas Schaefer is a 43 y.o. male here for follow up for seropositive RA on Rinvoq  15 mg p.o. daily.     He experiences ongoing joint pain and stiffness, primarily affecting his wrists, shoulders, ankles, and knees. Rinvoq  has been more effective than previous treatments, particularly in his hands, but significant discomfort persists in his knees, especially at night. He describes a new pain radiating from his back down the side of his leg, suspecting it may be related to his sciatic nerve.   He recently completed a prednisone  taper following bronchitis and an unspecified infection, which temporarily alleviated his joint pain. However, the pain returned shortly after discontinuing prednisone .   He has low vitamin D  levels and is taking supplements. Additionally, he is taking DHEA due to lower than normal testosterone  levels identified in recent blood work.   There is a history of elevated uric acid levels, and he recalls a previous episode of foot pain that responded well to medication, suggesting a possible gout diagnosis. More recently, in sports medicine with Dr. Claudene clinic an ultrasound apparently showed uric acid crystals in his joints. He recalls taking colchicine  in the past, which provided relief during a gout flare-up. He is uncertain about previous use of allopurinol , but notes that his uric acid levels have been high in the past, though they were within normal range during a recent check in April.   Previous HPI 12/10/2023 Thomas Schaefer is a 43 y.o. male here for follow up for  seropositive RA on Rinvoq  15 mg p.o. daily.   He is managing rheumatoid arthritis with Rinvoq  after discontinuing prednisone . Rinvoq  has effectively reduced pain in his hand joints, allowing him to close his fist without pain, though his fingers remain swollen, preventing him from wearing his ring. 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 The pain subsides after the first 10 to 20 steps.   He experiences sleep disturbances due to shoulder pain and restless legs. He describes numbness and a restless sensation in his shoulders when lying down, necessitating frequent position changes. His restless leg symptoms have worsened, characterized by an uncomfortable sensation that compels movement for relief. He has not previously taken medication for restless legs and denies any history of iron deficiency.   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 The results were normal except for slightly elevated cholesterol levels. No recent illnesses have been reported.      Previous HPI 09/04/2023 Thomas Schaefer is a 43 y.o. male here for follow up for seropositive RA on Rinvoq  15 mg p.o. daily and prednisone  5 mg twice daily.  Since witching to Rinvoq  he is overall mostly doing better.  Most of his joint pain and stiffness outside  of the right knee is controlled with just the Rinvoq .  He does notice symptoms creeping in a bit worse by the end of the day.  He decreased the prednisone  now taking just 5 mg once daily if he stops this for 2 or 3 days he redeveloped significant knee swelling.  He is also starting to get some more back pain and trouble with his hip especially during certain movements such as getting on and off the fire engine or other times bearing weight on his hip while it is  flexed.   Previous HPI 06/04/2023 Thomas Schaefer is a 43 y.o. male here for follow up for seropositive RA on Enbrel  50 mg subcu weekly and prednisone  5 mg twice daily.  He has tried decreasing prednisone  to only once daily on some days but when stopping completely he quickly saw increase in joint pain and swelling within days.  He has a stable amount of persistent right knee swelling.  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  After the blistering at the sting site drained swelling has mostly gone down except in his left index finger.  We spoke on the phone last month due to increasing trouble with pain in his shoulders with some numbness tingling and sometimes painful sensation radiating down the arms.  Especially has trouble with shoulder pain at night if he lies on either side which frequently disrupts his sleep.  Recommended neurology evaluation for nerve conduction study but he missed this appointment due to being sick with COVID and needs to reschedule.   Previous HPI 03/06/2023 Thomas Schaefer is a 43 y.o. male here for follow up for seronegative RA on Enbrel  now for 1 month and on prednisone  5 mg twice daily.  He is not noticing trouble with the Enbrel  but also has not seen a significant improvement in joint symptoms.  Symptoms are mostly controlled with 5 mg twice daily prednisone  still having swelling in his hands and wrists and bilateral shoulder pain.  Shoulder pain is most bothersome at nighttime and frequently awakens him from sleep requiring repositioning.  Also gets pain during the day most with overhead movements.  When decreasing the prednisone  further he notices some right-sided pain at the lower jaw and ear.  He has had pain in this area in multiple occasions on the past without specific cause identified that usually has resolved very quickly with  antibiotic treatment.   Previous HPI 01/23/23 Thomas Schaefer is a 43 y.o. male here for follow up for seropositiverheumatoid arthritis on leflunomide  20 mg daily.  Since last visit he had right knee arthroscopic surgery with some debridement of extensive synovial hypertrophy and some loose cartilage bodies.  Afterwards he discontinued prednisone  completely and after about 4 days started feeling generalized worsening of joint pain and stiffness and severe fatigue.  Initially tried managing symptoms with ibuprofen 800 mg after calling us  I recommended resuming a lower dose prednisone  and gradually tapering it off at 2-week increment.  So he is back to taking 5 mg twice daily right now and ibuprofen 800 mg every 6 hours and has been out of work for the past 2 weeks.  He is not having trouble taking the leflunomide  but has not seen any improvement yet.  His right knee actually feels well and everywhere else is more painful and inflamed.  Has bilateral hand swelling.  Notices weakness inability  to grip and lift strongly with his arms.  Also bilateral shoulder pain especially with overhead abduction and lying on his side in bed.   3/11 Right knee surgical pathology Chronically inflamed synovium with degenerative changes   Previous HPI 12/31/22 Thomas Schaefer is a 43 y.o. male here for follow up for RA and gout currently on prednisone  10 mg daily.  Right now he is in a symptom exacerbation doing much worse had a flareup after an exercise training session last week.  He is having pain in his right knee which has been the main ongoing issue also having more trouble with his shoulders right now with pain worse posteriorly especially feels increased pain when lying on his side in bed.  He has had some continued weight gain with the long-term prednisone  use starting phentermine  for weight loss has not seen any progress so far.  He is scheduled for right knee surgery coming up in 1 week.   Previous  HPI 08/27/22 Thomas Schaefer is a 43 y.o. male here for follow up for RA and gout currently on prednisone  10 mg daily. He stopped taking methotrexate  due to running out of prescribed tablets after about 1 month. He saw [facility] for follow up with repeat aspiration and injection in right knee and symptoms stayed better for a month or more before swelling and pain got worse again. No new flare ups anywhere else. He discussed possibility of arthroscopy early next year due to the chronic symptoms.   Previous HPI 06/04/22 Thomas Schaefer is a 43 y.o. male here for follow up joint inflammation. Since our last visit he saw Dr. Kendal and MRI of right knee obtained showing changes of synovitis and probable ruptured baker's cyst. He has had a steroid injection of the left knee and the left leg is doing well. Hand pain and stiffness are also doing very well mostly just has the knee pain. He started methotrexate  since 2 weeks ago tolerating it okay. He decreased prednisone  down to 10 mg once daily from twice for a week and is tolerating this okay his knee swelling increased some but not very painful.   Previous HPI 03/30/2022 Thomas Schaefer is a 43 y.o. male here for follow up for joint inflammation. Since our last visit he completed the prednisone  his pain was partially improved on this but quickly returned off the medication. Swelling never resolved. He now has increased symptoms involving his hands with decreased grip strength and mobility. He notices increasing pain in left knee and right ankle he thinks is compensating for his worse affected joints.   Previous HPI 03/14/2022 Thomas Schaefer is a 43 y.o. male here for follow up for joint inflammation.  Shortly after completing most recent steroids he has redeveloped increased swelling of the right knee and swelling and pain in the left ankle.  He noticed the most recent steroid treatment improved his swelling but the left ankle pain was not  dramatically improved. So far he has not seen an obvious difference in swelling while taking the sulfasalazine .   Previous HPI 02/27/2022 Thomas Schaefer is a 43 y.o. male here for follow up for joint inflammation. His knee improved greatly after aspiration and with prednisone  taper but since 4 days ago left ankle pain and inflammation started. Pain is worst first thing in the morning and when standing up gets partially better when up and walking around. He is not taking anything for this currently    Previous HPI 02/07/22 Thomas Schaefer is a 43  y.o. male here for follow up for joint inflammation previously seen in October initial plan to treat gouty arthritis not clear if also having RA with a positive RF. He has suffered multiple and persistent joint inflammation treated with colchicine  and multiple prednisone  courses. He started taking allopurinol  last month and within days developed severe pain and stiffness in his right knee and some pain extending up to the right hip posteriorly. Pain is intermittent and stabbing, and he cannot fully extend his knee. Symptoms are not similar to anything that he had in the past. He took 2 12 day prednisone  tapers these improved symptoms but came back again once down to about 20 mg daily dose. He stopped taking colchicine  in accordance with directions when prescribed the prednisone .   Previous HPI 08/21/21 Thomas Schaefer is a 43 y.o. male here for foot pain and inflammation with elevated rheumatoid factor and uric acid.  Symptoms started since around July of this year with pain on the right foot worse at the bottom of the foot and the smaller for toes. Initial treatment with colchicine  had partial benefit in symptoms.  There is also considerable initial swelling.  He received a short course of oral steroids with more symptom improvement but this persisted. He saw Dr. Verta in September treatment with local steroid infection and oral prednisone  taper and blood tests  were checked.  Inflammatory markers were normal but rheumatoid factor and uric acid were significantly elevated.  Xray of the foot has shown soft tissue edema without significant osseus abnormalities.  After the injection and additional medication symptoms again improved at this point he is having persistent mild daily symptoms with some pain worsened with pressure and weight on the ball of his foot without much active swelling. He has had some chronic pain and instability of the right knee for a much longer time without any identified problem, xrays have been normal.  He does not drink much alcohol regularly.  He used to drink sodas very heavily but has already decreased this to regular but small amounts before any onset of the symptoms.   Labs reviewed 06/2021 ANA neg RF 125 Uric acid 11.1 ESR 6 CRP 3.0    No Rheumatology ROS completed.   PMFS History:  Patient Active Problem List   Diagnosis Date Noted   Piriformis syndrome of left side 05/20/2024   Vitamin D  deficiency 01/30/2024   Impingement of shoulder 06/04/2023   Right ear pain 03/06/2023   High risk medication use 12/31/2022   Dyslipidemia, goal LDL below 100 10/20/2022   Other fatigue 09/03/2022   Abnormal fasting glucose 09/03/2022   Polyarticular gout 03/30/2022   Seronegative rheumatoid arthritis (HCC) 01/15/2022   Body mass index (BMI) of 31.0-31.9 in adult 01/15/2022   BMI 40.0-44.9, adult (HCC) 12/03/2021   Hyperuricemia 08/21/2021   Obesity 07/13/2015   Mood disorder (HCC) 07/13/2015   Allergic rhinitis 06/06/2007   GERD 06/06/2007   IRRITABLE BOWEL SYNDROME 06/06/2007    Past Medical History:  Diagnosis Date   GERD (gastroesophageal reflux disease)    Hyperlipidemia    Inflammatory polyarthritis (HCC)     Family History  Problem Relation Age of Onset   Mental illness Mother    Drug abuse Mother    Diabetes Father    Alcohol abuse Father    Stroke Father    Alcohol abuse Maternal Uncle    Past  Surgical History:  Procedure Laterality Date   KNEE SURGERY Right    TOE SURGERY Left  VASECTOMY     Social History   Social History Narrative   Not on file   Immunization History  Administered Date(s) Administered   Moderna Sars-Covid-2 Vaccination 11/04/2019, 12/03/2019     Objective: Vital Signs: There were no vitals taken for this visit.   Physical Exam   Musculoskeletal Exam: ***  CDAI Exam: CDAI Score: -- Patient Global: --; Provider Global: -- Swollen: --; Tender: -- Joint Exam 07/22/2024   No joint exam has been documented for this visit   There is currently no information documented on the homunculus. Go to the Rheumatology activity and complete the homunculus joint exam.  Investigation: No additional findings.  Imaging: No results found.  Recent Labs: Lab Results  Component Value Date   WBC 5.9 04/21/2024   HGB 14.2 04/21/2024   PLT 230 04/21/2024   NA 141 04/21/2024   K 4.2 04/21/2024   CL 107 04/21/2024   CO2 26 04/21/2024   GLUCOSE 106 (H) 04/21/2024   BUN 11 04/21/2024   CREATININE 1.03 04/21/2024   BILITOT 0.5 04/21/2024   ALKPHOS 79 09/03/2022   AST 30 04/21/2024   ALT 38 04/21/2024   PROT 6.4 04/21/2024   ALBUMIN 3.8 (L) 09/03/2022   CALCIUM 9.2 04/21/2024   GFRAA >60 06/24/2017    Speciality Comments: Patient had monitoring labs in 11/2023, media in [place] message  Procedures:  No procedures performed Allergies: Bee venom   Assessment / Plan:     Visit Diagnoses: No diagnosis found.  ***  Orders: No orders of the defined types were placed in this encounter.  No orders of the defined types were placed in this encounter.    Follow-Up Instructions: No follow-ups on file.   Kima Malenfant M Mitsuo Budnick, CMA  Note - This record has been created using Animal nutritionist.  Chart creation errors have been sought, but may not always  have been located. Such creation errors do not reflect on  the standard of medical care.

## 2024-07-21 NOTE — Progress Notes (Unsigned)
 [facility] [address] [place] [address] Phone: [phone] Subjective:   [name], am serving as a scribe for Dr. Arthea Claudene.  I'm seeing this patient by the request  of:  Chandra Toribio POUR, MD  CC: Gouty arthritis follow-up  YEP:Dlagzrupcz  05/20/2024 Using [employer] Anatomy, reviewed with the patient the structures involved and how they related to diagnosis. The patient indicated understanding.    The patient was given a handout about classic piriformis stretching including [facility], [facility], my self-described Sink Stretch, and other piriformis rehab.   We also reviewed hip flexor and abductor strengthening, ham stretching   Rec deep massage, explained self-massage with ball    Continues to have elevated uric acid. Did come down while patient was on prednisone . Still concerning in my opinion. Do think that a seronegative rheumatoid arthritis could also be contributing. Started on Uloric . Encouraged him to follow-up with rheumatologist as well. Will recheck uric acid in 8 weeks after starting it. Warned of potential side effects. Follow-up with me again in 2 to 3 months   Updated 07/22/2024 Thomas Schaefer is a 43 y.o. male coming in with complaint of L side pain discussed with patient about the piriformis as well on the left side.  Has polyarticular gout also noted.  Patient states medicine causing acid reflux? L side pain is better than before.       Past Medical History:  Diagnosis Date   GERD (gastroesophageal reflux disease)    Hyperlipidemia    Inflammatory polyarthritis (HCC)    Past Surgical History:  Procedure Laterality Date   KNEE SURGERY Right    TOE SURGERY Left    VASECTOMY     Social History   Socioeconomic History   Marital status: Married    Spouse name: Not on file   Number of children: Not on file   Years of education: Not on file   Highest education level: Not on file   Occupational History   Not on file  Tobacco Use   Smoking status: Former    Current packs/day: 0.00    Average packs/day: 1 pack/day for 11.0 years (11.0 ttl pk-yrs)    Types: Cigarettes    Start date: 15    Quit date: 2005    Years since quitting: 20.7    Passive exposure: Past   Smokeless tobacco: Never  Vaping Use   Vaping status: Never Used  Substance and Sexual Activity   Alcohol use: Not Currently    Comment: rarely   Drug use: No   Sexual activity: Yes  Other Topics Concern   Not on file  Social History Narrative   Not on file   Social Drivers of Health   Financial Resource Strain: Not on file  Food Insecurity: Not on file  Transportation Needs: Not on file  Physical Activity: Not on file  Stress: Not on file  Social Connections: Not on file   Allergies  Allergen Reactions   Bee Venom    Family History  Problem Relation Age of Onset   Mental illness Mother    Drug abuse Mother    Diabetes Father    Alcohol abuse Father    Stroke Father    Alcohol abuse Maternal Uncle     Current Outpatient Medications (Endocrine & Metabolic):    predniSONE  (DELTASONE ) 20 MG tablet, Take 1 tablet (20 mg total) by mouth daily with breakfast.  Current Outpatient Medications (Cardiovascular):  EPIPEN 2-PAK 0.3 MG/0.3ML SOAJ injection,   Current Outpatient Medications (Respiratory):    albuterol  (VENTOLIN  HFA) 108 (90 Base) MCG/ACT inhaler, Inhale 2 puffs into the lungs every 4 (four) hours as needed for wheezing or shortness of breath.   benzonatate  (TESSALON ) 200 MG capsule, Take 1 capsule (200 mg total) by mouth 3 (three) times daily as needed for cough.   levocetirizine (XYZAL ) 5 MG tablet, Take 1 tablet (5 mg total) by mouth every evening.   promethazine -dextromethorphan (PROMETHAZINE -DM) 6.25-15 MG/5ML syrup, Take 5 mLs by mouth 4 (four) times daily as needed for cough.  Current Outpatient Medications (Analgesics):    colchicine  0.6 MG tablet, Take 2 tablets  on 1st day then 1 tablet daily for 5 days   febuxostat  (ULORIC ) 40 MG tablet, Take 1 tablet (40 mg total) by mouth daily.   ibuprofen (ADVIL) 200 MG tablet, Take 800 mg by mouth as needed.   Upadacitinib  ER (RINVOQ ) 15 MG TB24, Take 1 tablet (15 mg total) by mouth daily.   Current Outpatient Medications (Other):    Vitamin D , Ergocalciferol , (DRISDOL ) 1.25 MG (50000 UNIT) CAPS capsule, Take 1 capsule (50,000 Units total) by mouth every 7 (seven) days.   Reviewed prior external information including notes and imaging from  primary care provider As well as notes that were available from care everywhere and other healthcare systems.  Past medical history, social, surgical and family history all reviewed in electronic medical record.  No pertanent information unless stated regarding to the chief complaint.   Review of Systems:  No headache, visual changes, nausea, vomiting, diarrhea, constipation, dizziness, abdominal pain, skin rash, fevers, chills, night sweats, weight loss, swollen lymph nodes, body aches, joint swelling, chest pain, shortness of breath, mood changes. POSITIVE muscle aches  Objective  Blood pressure 120/74, pulse 71, height 6' 1 (1.854 m), weight (!) 323 lb (146.5 kg), SpO2 96%.   General: No apparent distress alert and oriented x3 mood and affect normal, dressed appropriately.  HEENT: Pupils equal, extraocular movements intact  Respiratory: Patient's speak in full sentences and does not appear short of breath  Cardiovascular: No lower extremity edema, non tender, no erythema      Impression and Recommendations:    The above documentation has been reviewed and is accurate and complete Torsha Lemus M Korianna Washer, DO

## 2024-07-22 ENCOUNTER — Ambulatory Visit: Admitting: Family Medicine

## 2024-07-22 ENCOUNTER — Ambulatory Visit: Admitting: Internal Medicine

## 2024-07-22 VITALS — BP 120/74 | HR 71 | Ht 73.0 in | Wt 323.0 lb

## 2024-07-22 DIAGNOSIS — M25819 Other specified joint disorders, unspecified shoulder: Secondary | ICD-10-CM

## 2024-07-22 DIAGNOSIS — Z79899 Other long term (current) drug therapy: Secondary | ICD-10-CM

## 2024-07-22 DIAGNOSIS — E559 Vitamin D deficiency, unspecified: Secondary | ICD-10-CM

## 2024-07-22 DIAGNOSIS — M0600 Rheumatoid arthritis without rheumatoid factor, unspecified site: Secondary | ICD-10-CM

## 2024-07-22 DIAGNOSIS — E790 Hyperuricemia without signs of inflammatory arthritis and tophaceous disease: Secondary | ICD-10-CM | POA: Diagnosis not present

## 2024-07-22 DIAGNOSIS — M109 Gout, unspecified: Secondary | ICD-10-CM

## 2024-07-22 LAB — VITAMIN D 25 HYDROXY (VIT D DEFICIENCY, FRACTURES): VITD: 22.03 ng/mL — ABNORMAL LOW (ref 30.00–100.00)

## 2024-07-22 LAB — URIC ACID: Uric Acid, Serum: 5 mg/dL (ref 4.0–7.8)

## 2024-07-22 NOTE — Assessment & Plan Note (Signed)
 Has been responding extremely well to the Uloric .  Unfortunately potentially having some reflux.  Discussed potentially decreasing to 3 times a week.  Depending on how patient responds to that also discussed over-the-counter Prilosec.  Follow-up in 3 to 4 months

## 2024-07-22 NOTE — Patient Instructions (Addendum)
 Labs today Decrease medicine to 3x a week for 2 week  If you still have reflux get nexium and take for 2 weeks See you again in 4 months

## 2024-07-22 NOTE — Assessment & Plan Note (Signed)
 Recheck vitamin D

## 2024-07-23 ENCOUNTER — Ambulatory Visit: Payer: Self-pay | Admitting: Family Medicine

## 2024-07-23 ENCOUNTER — Other Ambulatory Visit: Payer: Self-pay

## 2024-07-23 MED ORDER — VITAMIN D (ERGOCALCIFEROL) 1.25 MG (50000 UNIT) PO CAPS: 50000.0000 [IU] | ORAL_CAPSULE | ORAL | 0 refills | Status: AC

## 2024-07-23 NOTE — Assessment & Plan Note (Signed)
 Recheck labs again today.  Has done well with the large but possibly given her reflux disease.  Discussed decreasing use depending on findings of the lab.  Otherwise treatment with a PPI

## 2024-08-07 ENCOUNTER — Other Ambulatory Visit: Payer: Self-pay

## 2024-08-07 NOTE — Progress Notes (Signed)
 Specialty Pharmacy Refill Coordination Note  Thomas Schaefer is a 43 y.o. male contacted today regarding refills of specialty medication(s) Upadacitinib  (Rinvoq )   Patient requested Delivery   Delivery date: 08/10/24   Verified address: [address] [place], [place] [address]   Medication will be filled on 08/07/24.

## 2024-08-28 ENCOUNTER — Other Ambulatory Visit (HOSPITAL_COMMUNITY): Payer: Self-pay

## 2024-08-28 ENCOUNTER — Other Ambulatory Visit: Payer: Self-pay | Admitting: Internal Medicine

## 2024-08-28 DIAGNOSIS — Z79899 Other long term (current) drug therapy: Secondary | ICD-10-CM

## 2024-08-28 DIAGNOSIS — M0600 Rheumatoid arthritis without rheumatoid factor, unspecified site: Secondary | ICD-10-CM

## 2024-09-01 ENCOUNTER — Other Ambulatory Visit: Payer: Self-pay

## 2024-09-03 ENCOUNTER — Other Ambulatory Visit: Payer: Self-pay

## 2024-09-08 ENCOUNTER — Other Ambulatory Visit: Payer: Self-pay

## 2024-09-09 ENCOUNTER — Other Ambulatory Visit: Payer: Self-pay | Admitting: Internal Medicine

## 2024-09-09 DIAGNOSIS — M0600 Rheumatoid arthritis without rheumatoid factor, unspecified site: Secondary | ICD-10-CM

## 2024-09-09 DIAGNOSIS — Z79899 Other long term (current) drug therapy: Secondary | ICD-10-CM

## 2024-09-11 ENCOUNTER — Other Ambulatory Visit: Payer: Self-pay

## 2024-09-15 ENCOUNTER — Telehealth: Payer: Self-pay | Admitting: Internal Medicine

## 2024-09-15 DIAGNOSIS — Z79899 Other long term (current) drug therapy: Secondary | ICD-10-CM

## 2024-09-15 DIAGNOSIS — Z111 Encounter for screening for respiratory tuberculosis: Secondary | ICD-10-CM

## 2024-09-15 DIAGNOSIS — Z9225 Personal history of immunosupression therapy: Secondary | ICD-10-CM

## 2024-09-15 NOTE — Telephone Encounter (Signed)
 Patient contacted the office to request a medication refill.   1. Name of Medication: Rinvoq   2. How are you currently taking this medication (dosage and times per day)? One tablet daily   3. What pharmacy would you like for that to be sent to? [facility]

## 2024-09-15 NOTE — Telephone Encounter (Signed)
 Left message to advise patient he will need updated labs prior to refilling his medication. Last standing labs were in June and no TB Gold on file. Future orders placed.

## 2024-09-16 ENCOUNTER — Other Ambulatory Visit: Payer: Self-pay

## 2024-09-16 ENCOUNTER — Ambulatory Visit: Admitting: Internal Medicine

## 2024-09-16 DIAGNOSIS — Z79899 Other long term (current) drug therapy: Secondary | ICD-10-CM | POA: Diagnosis not present

## 2024-09-16 DIAGNOSIS — Z9225 Personal history of immunosupression therapy: Secondary | ICD-10-CM | POA: Diagnosis not present

## 2024-09-16 DIAGNOSIS — Z111 Encounter for screening for respiratory tuberculosis: Secondary | ICD-10-CM

## 2024-09-16 DIAGNOSIS — M0600 Rheumatoid arthritis without rheumatoid factor, unspecified site: Secondary | ICD-10-CM

## 2024-09-16 MED ORDER — RINVOQ 15 MG PO TB24
15.0000 mg | ORAL_TABLET | Freq: Every day | ORAL | 0 refills | Status: AC
  Filled 2024-09-16: qty 30, 30d supply, fill #0
  Filled 2024-10-09: qty 30, 30d supply, fill #1
  Filled 2024-11-06 – 2024-11-09 (×2): qty 30, 30d supply, fill #2

## 2024-09-16 NOTE — Telephone Encounter (Signed)
 Patient requested refill of Rinvoq . He only has two tablets left.   Last Fill: 04/21/2024  Labs: 04/21/2024 glucose 106 09/16/2024 results pending   TB Gold: 09/16/2024 results pending    Next Visit: 12/01/2024  Last Visit: 04/21/2024  IK:Dzmnwzhjupcz rheumatoid arthritis   Current Dose per office note on 04/21/2024: Rinvoq  15 mg daily   Okay to refill Rinvoq ?

## 2024-09-17 ENCOUNTER — Ambulatory Visit: Payer: Self-pay | Admitting: Internal Medicine

## 2024-09-17 ENCOUNTER — Other Ambulatory Visit: Payer: Self-pay

## 2024-09-18 ENCOUNTER — Other Ambulatory Visit: Payer: Self-pay

## 2024-09-18 LAB — CBC WITH DIFFERENTIAL/PLATELET
Absolute Lymphocytes: 1163 {cells}/uL (ref 850–3900)
Absolute Monocytes: 321 {cells}/uL (ref 200–950)
Basophils Absolute: 31 {cells}/uL (ref 0–200)
Basophils Relative: 0.6 %
Eosinophils Absolute: 214 {cells}/uL (ref 15–500)
Eosinophils Relative: 4.2 %
HCT: 43.7 % (ref 38.5–50.0)
Hemoglobin: 14.6 g/dL (ref 13.2–17.1)
MCH: 29 pg (ref 27.0–33.0)
MCHC: 33.4 g/dL (ref 32.0–36.0)
MCV: 86.9 fL (ref 80.0–100.0)
MPV: 10.7 fL (ref 7.5–12.5)
Monocytes Relative: 6.3 %
Neutro Abs: 3371 {cells}/uL (ref 1500–7800)
Neutrophils Relative %: 66.1 %
Platelets: 232 Thousand/uL (ref 140–400)
RBC: 5.03 Million/uL (ref 4.20–5.80)
RDW: 12.9 % (ref 11.0–15.0)
Total Lymphocyte: 22.8 %
WBC: 5.1 Thousand/uL (ref 3.8–10.8)

## 2024-09-18 LAB — COMPREHENSIVE METABOLIC PANEL WITH GFR
AG Ratio: 1.7 (calc) (ref 1.0–2.5)
ALT: 39 U/L (ref 9–46)
AST: 34 U/L (ref 10–40)
Albumin: 4.1 g/dL (ref 3.6–5.1)
Alkaline phosphatase (APISO): 67 U/L (ref 36–130)
BUN: 9 mg/dL (ref 7–25)
CO2: 28 mmol/L (ref 20–32)
Calcium: 8.8 mg/dL (ref 8.6–10.3)
Chloride: 105 mmol/L (ref 98–110)
Creat: 1.11 mg/dL (ref 0.60–1.29)
Globulin: 2.4 g/dL (ref 1.9–3.7)
Glucose, Bld: 87 mg/dL (ref 65–99)
Potassium: 4.1 mmol/L (ref 3.5–5.3)
Sodium: 141 mmol/L (ref 135–146)
Total Bilirubin: 0.6 mg/dL (ref 0.2–1.2)
Total Protein: 6.5 g/dL (ref 6.1–8.1)
eGFR: 84 mL/min/1.73m2 (ref >=60)

## 2024-09-18 LAB — QUANTIFERON-TB GOLD PLUS
Mitogen-NIL: 8.36 [IU]/mL
NIL: 0.01 [IU]/mL
QuantiFERON-TB Gold Plus: NEGATIVE
TB1-NIL: 0 [IU]/mL
TB2-NIL: 0 [IU]/mL

## 2024-09-18 NOTE — Progress Notes (Signed)
 Specialty Pharmacy Ongoing Clinical Assessment Note  Thomas Schaefer is a 43 y.o. male who is being followed by the specialty pharmacy service for RxSp Rheumatoid Arthritis   Patient's specialty medication(s) reviewed today: Upadacitinib  (Rinvoq )   Missed doses in the last 4 weeks: 0   Patient/Caregiver did not have any additional questions or concerns.   Therapeutic benefit summary: Patient is achieving benefit   Adverse events/side effects summary: No adverse events/side effects   Patient's therapy is appropriate to: Continue    Goals Addressed             This Visit's Progress    Reduce signs and symptoms   On track    Patient is on track. Patient will maintain adherence         Follow up: 12 months  [facility]

## 2024-09-18 NOTE — Progress Notes (Signed)
 Specialty Pharmacy Refill Coordination Note  Thomas Schaefer is a 43 y.o. male contacted today regarding refills of specialty medication(s) Upadacitinib  (Rinvoq )   Patient requested [name] at [facility] [place] date: 09/18/24   Medication will be filled on: 09/18/24

## 2024-09-26 ENCOUNTER — Ambulatory Visit: Admission: EM | Admit: 2024-09-26 | Discharge: 2024-09-26 | Disposition: A | Discharge status: Home / Self Care

## 2024-09-26 ENCOUNTER — Encounter: Payer: Self-pay | Admitting: Emergency Medicine

## 2024-09-26 DIAGNOSIS — H60502 Unspecified acute noninfective otitis externa, left ear: Secondary | ICD-10-CM | POA: Diagnosis not present

## 2024-09-26 MED ORDER — CIPROFLOXACIN-DEXAMETHASONE 0.3-0.1 % OT SUSP: 4.0000 [drp] | Freq: Two times a day (BID) | OTIC | 0 refills | Status: AC

## 2024-09-26 NOTE — ED Triage Notes (Signed)
 Pt presents c/o otalgia x 3 days. Pt states,  I think I got an ear infection. I usually get them this time of year. I can't even touch the area around my ear. I took some prednisone  last night for some relief. It took a little bit of the edge off but not a lot. I usually take it for arthritis and gout.

## 2024-09-26 NOTE — ED Provider Notes (Signed)
 [facility]    CSN: [id number] Arrival date & time: 09/26/24  0800      History   Chief Complaint Chief Complaint  Patient presents with   Otalgia    HPI Thomas Schaefer is a 43 y.o. male.   Pt presents today due to 3 days worth of left ear pain. Pt states that he has had similar issues in the past. Pt states that he has been using ibuprofen as needed for pain.   The history is provided by the patient.  Otalgia   Past Medical History:  Diagnosis Date   GERD (gastroesophageal reflux disease)    Hyperlipidemia    Inflammatory polyarthritis (HCC)     Patient Active Problem List   Diagnosis Date Noted   Piriformis syndrome of left side 05/20/2024   Vitamin D  deficiency 01/30/2024   Impingement of shoulder 06/04/2023   Right ear pain 03/06/2023   High risk medication use 12/31/2022   Dyslipidemia, goal LDL below 100 10/20/2022   Other fatigue 09/03/2022   Abnormal fasting glucose 09/03/2022   Polyarticular gout 03/30/2022   Seronegative rheumatoid arthritis (HCC) 01/15/2022   Body mass index (BMI) of 31.0-31.9 in adult 01/15/2022   BMI 40.0-44.9, adult (HCC) 12/03/2021   Hyperuricemia 08/21/2021   Obesity 07/13/2015   Mood disorder 07/13/2015   Allergic rhinitis 06/06/2007   GERD 06/06/2007   IRRITABLE BOWEL SYNDROME 06/06/2007    Past Surgical History:  Procedure Laterality Date   KNEE SURGERY Right    TOE SURGERY Left    VASECTOMY         Home Medications    Prior to Admission medications   Medication Sig Start Date End Date Taking? Authorizing Provider  ciprofloxacin-dexamethasone (CIPRODEX) OTIC suspension Place 4 drops into the left ear 2 (two) times daily for 7 days. 09/26/24 10/03/24 Yes Andra Krabbe C, PA-C  clindamycin (CLEOCIN T) 1 % external solution Apply topically 2 (two) times daily. 05/20/24  Yes [provider]  doxycycline (VIBRAMYCIN) 100 MG capsule Take 100 mg by mouth daily. 05/20/24  Yes [provider]  albuterol  (VENTOLIN  HFA) 108 (90 Base) MCG/ACT inhaler Inhale 2 puffs into the lungs every 4 (four) hours as needed for wheezing or shortness of breath. 03/04/24   Arloa Suzen RAMAN, NP  benzonatate  (TESSALON ) 200 MG capsule Take 1 capsule (200 mg total) by mouth 3 (three) times daily as needed for cough. 02/06/24   Arloa Suzen RAMAN, NP  colchicine  0.6 MG tablet Take 2 tablets on 1st day then 1 tablet daily for 5 days 04/21/24   Jeannetta Lonni ORN, MD  EPIPEN 2-PAK 0.3 MG/0.3ML SOAJ injection  04/20/15   [provider]  febuxostat  (ULORIC ) 40 MG tablet Take 1 tablet (40 mg total) by mouth daily. 06/26/24   Smith, Zachary M, DO  ibuprofen (ADVIL) 200 MG tablet Take 800 mg by mouth as needed.    [provider]  levocetirizine (XYZAL ) 5 MG tablet Take 1 tablet (5 mg total) by mouth every evening. 03/04/24   Arloa Suzen RAMAN, NP  predniSONE  (DELTASONE ) 20 MG tablet Take 1 tablet (20 mg total) by mouth daily with breakfast. 05/20/24   Claudene Arthea HERO, DO  promethazine -dextromethorphan (PROMETHAZINE -DM) 6.25-15 MG/5ML syrup Take 5 mLs by mouth 4 (four) times daily as needed for cough. 03/04/24   Arloa Suzen RAMAN, NP  Upadacitinib  ER (RINVOQ ) 15 MG TB24 Take 1 tablet (15 mg total) by mouth daily. 09/16/24   Jeannetta Lonni ORN, MD  Vitamin D ,  Ergocalciferol , (DRISDOL ) 1.25 MG (50000 UNIT) CAPS capsule Take 1 capsule (50,000 Units total) by mouth every 7 (seven) days. 07/23/24   Claudene Arthea HERO, DO    Family History Family History  Problem Relation Age of Onset   Mental illness Mother    Drug abuse Mother    Diabetes Father    Alcohol abuse Father    Stroke Father    Alcohol abuse Maternal Uncle     Social History Social History   Tobacco Use   Smoking status: Former    Current packs/day: 0.00    Average packs/day: 1 pack/day for 11.0 years (11.0 ttl pk-yrs)    Types: Cigarettes    Start date: 60    Quit date: 2005    Years since quitting: 20.9     Passive exposure: Past   Smokeless tobacco: Never  Vaping Use   Vaping status: Never Used  Substance Use Topics   Alcohol use: Not Currently    Comment: rarely   Drug use: No     Allergies   Bee venom   Review of Systems Review of Systems  HENT:  Positive for ear pain.      Physical Exam Triage Vital Signs ED Triage Vitals  Encounter Vitals Group     BP 09/26/24 0819 122/83     Girls Systolic BP Percentile --      Girls Diastolic BP Percentile --      Boys Systolic BP Percentile --      Boys Diastolic BP Percentile --      Pulse Rate 09/26/24 0819 71     Resp 09/26/24 0819 18     Temp 09/26/24 0819 98 F (36.7 C)     Temp Source 09/26/24 0819 Oral     SpO2 09/26/24 0819 97 %     Weight 09/26/24 0818 (!) 322 lb 15.6 oz (146.5 kg)     Height --      Head Circumference --      Peak Flow --      Pain Score 09/26/24 0818 3     Pain Loc --      Pain Education --      Exclude from Growth Chart --    No data found.  Updated Vital Signs BP 122/83 (BP Location: Left Arm)   Pulse 71   Temp 98 F (36.7 C) (Oral)   Resp 18   Wt (!) 322 lb 15.6 oz (146.5 kg)   SpO2 97%   BMI 42.61 kg/m   Visual Acuity Right Eye Distance:   Left Eye Distance:   Bilateral Distance:    Right Eye Near:   Left Eye Near:    Bilateral Near:     Physical Exam Vitals and nursing note reviewed.  Constitutional:      General: He is not in acute distress.    Appearance: Normal appearance. He is not ill-appearing, toxic-appearing or diaphoretic.  HENT:     Right Ear: Tympanic membrane, ear canal and external ear normal.     Ears:     Comments: Tenderness to palpation of left tragus and manipulation left pinna Eyes:     General: No scleral icterus. Cardiovascular:     Rate and Rhythm: Normal rate and regular rhythm.     Heart sounds: Normal heart sounds.  Pulmonary:     Effort: Pulmonary effort is normal. No respiratory distress.     Breath sounds: Normal breath sounds. No  wheezing or rhonchi.  Skin:  General: Skin is warm.  Neurological:     Mental Status: He is alert and oriented to person, place, and time.  Psychiatric:        Mood and Affect: Mood normal.        Behavior: Behavior normal.      UC Treatments / Results  Labs (all labs ordered are listed, but only abnormal results are displayed) Labs Reviewed - No data to display  EKG   Radiology No results found.  Procedures Procedures (including critical care time)  Medications Ordered in UC Medications - No data to display  Initial Impression / Assessment and Plan / UC Course  I have reviewed the triage vital signs and the nursing notes.  Pertinent labs & imaging results that were available during my care of the patient were reviewed by me and considered in my medical decision making (see chart for details).     Final Clinical Impressions(s) / UC Diagnoses   Final diagnoses:  Acute otitis externa of left ear, unspecified type   Discharge Instructions   None    ED Prescriptions     Medication Sig Dispense Auth. Provider   ciprofloxacin-dexamethasone (CIPRODEX) OTIC suspension Place 4 drops into the left ear 2 (two) times daily for 7 days. 2.8 mL Andra Corean BROCKS, PA-C      PDMP not reviewed this encounter.   Andra Corean BROCKS, PA-C 09/26/24 [phone]

## 2024-10-09 ENCOUNTER — Other Ambulatory Visit: Payer: Self-pay

## 2024-10-12 ENCOUNTER — Other Ambulatory Visit: Payer: Self-pay | Admitting: Pharmacy Technician

## 2024-10-12 ENCOUNTER — Other Ambulatory Visit: Payer: Self-pay

## 2024-10-12 NOTE — Progress Notes (Signed)
 Specialty Pharmacy Refill Coordination Note  Thomas Schaefer is a 43 y.o. male contacted today regarding refills of specialty medication(s) Upadacitinib  (Rinvoq )   Patient requested Delivery   Delivery date: 10/16/24   Verified address: [address] [place]   Medication will be filled on: 10/15/24

## 2024-10-19 NOTE — Progress Notes (Unsigned)
 " [facility] [address] [place] [address] Phone: [phone] Subjective:   [name], am serving as a scribe for Dr. Arthea Claudene.  I'm seeing this patient by the request  of:  Chandra Toribio POUR, MD  CC: Back pain and allover pain follow-up  YEP:Dlagzrupcz  07/22/2024 Recheck labs again today.  Has done well with the large but possibly given her reflux disease.  Discussed decreasing use depending on findings of the lab.  Otherwise treatment with a PPI     Recheck vitamin D      Has been responding extremely well to the Uloric .  Unfortunately potentially having some reflux.  Discussed potentially decreasing to 3 times a week.  Depending on how patient responds to that also discussed over-the-counter Prilosec.  Follow-up in 3 to 4 months      Update 10/20/2024 Thomas Schaefer is a 43 y.o. male coming in with complaint of gout. Patient states has been doing fine. Doing okay overall.  Feels like he has been able to be more active.  Losing weight.  Feeling good on his rheumatological medications as well.       Past Medical History:  Diagnosis Date   GERD (gastroesophageal reflux disease)    Hyperlipidemia    Inflammatory polyarthritis (HCC)    Past Surgical History:  Procedure Laterality Date   KNEE SURGERY Right    TOE SURGERY Left    VASECTOMY     Social History   Socioeconomic History   Marital status: Married    Spouse name: Not on file   Number of children: Not on file   Years of education: Not on file   Highest education level: Not on file  Occupational History   Not on file  Tobacco Use   Smoking status: Former    Current packs/day: 0.00    Average packs/day: 1 pack/day for 11.0 years (11.0 ttl pk-yrs)    Types: Cigarettes    Start date: 26    Quit date: 2005    Years since quitting: 20.9    Passive exposure: Past   Smokeless tobacco: Never  Vaping Use   Vaping status: Never Used  Substance and Sexual  Activity   Alcohol use: Not Currently    Comment: rarely   Drug use: No   Sexual activity: Yes  Other Topics Concern   Not on file  Social History Narrative   Not on file   Social Drivers of Health   Tobacco Use: Medium Risk (09/26/2024)   Patient History    Smoking Tobacco Use: Former    Smokeless Tobacco Use: Never    Passive Exposure: Past  Physicist, Medical Strain: Not on [employer] Insecurity: Not on file  Transportation Needs: Not on file  Physical Activity: Not on file  Stress: Not on file  Social Connections: Not on file  Depression (PHQ2-9): Medium Risk (01/10/2023)   Depression (PHQ2-9)    PHQ-2 Score: 7  Alcohol Screen: Not on file  Housing: Not on file  Utilities: Not on file  Health Literacy: Not on file   Allergies[1] Family History  Problem Relation Age of Onset   Mental illness Mother    Drug abuse Mother    Diabetes Father    Alcohol abuse Father    Stroke Father    Alcohol abuse Maternal Uncle     Current Outpatient Medications (Endocrine & Metabolic):    predniSONE  (DELTASONE ) 20 MG tablet, Take 1 tablet (20 mg total)  by mouth daily with breakfast.  Current Outpatient Medications (Cardiovascular):    EPIPEN 2-PAK 0.3 MG/0.3ML SOAJ injection,   Current Outpatient Medications (Respiratory):    albuterol  (VENTOLIN  HFA) 108 (90 Base) MCG/ACT inhaler, Inhale 2 puffs into the lungs every 4 (four) hours as needed for wheezing or shortness of breath.   benzonatate  (TESSALON ) 200 MG capsule, Take 1 capsule (200 mg total) by mouth 3 (three) times daily as needed for cough.   levocetirizine (XYZAL ) 5 MG tablet, Take 1 tablet (5 mg total) by mouth every evening.   promethazine -dextromethorphan (PROMETHAZINE -DM) 6.25-15 MG/5ML syrup, Take 5 mLs by mouth 4 (four) times daily as needed for cough.  Current Outpatient Medications (Analgesics):    colchicine  0.6 MG tablet, Take 2 tablets on 1st day then 1 tablet daily for 5 days   febuxostat  (ULORIC ) 40 MG  tablet, Take 1 tablet (40 mg total) by mouth daily.   ibuprofen (ADVIL) 200 MG tablet, Take 800 mg by mouth as needed.   Upadacitinib  ER (RINVOQ ) 15 MG TB24, Take 1 tablet (15 mg total) by mouth daily.  Current Outpatient Medications (Other):    clindamycin (CLEOCIN T) 1 % external solution, Apply topically 2 (two) times daily.   doxycycline (VIBRAMYCIN) 100 MG capsule, Take 100 mg by mouth daily.   Vitamin D , Ergocalciferol , (DRISDOL ) 1.25 MG (50000 UNIT) CAPS capsule, Take 1 capsule (50,000 Units total) by mouth every 7 (seven) days.    Objective  Blood pressure 124/84, pulse 72, height 6' 1 (1.854 m), weight (!) 317 lb (143.8 kg), SpO2 98%.   General: No apparent distress alert and oriented x3 mood and affect normal, dressed appropriately.  HEENT: Pupils equal, extraocular movements intact  Respiratory: Patient's speak in full sentences and does not appear short of breath  Cardiovascular: No lower extremity edema, non tender, no erythema  Patient's low back seems to be relatively unremarkable.  Sitting comfortably.  Deferred rest of exam    Impression and Recommendations:    The above documentation has been reviewed and is accurate and complete Arthea CHRISTELLA Sharps, DO        [1]  Allergies Allergen Reactions   Bee Venom    "

## 2024-10-20 ENCOUNTER — Ambulatory Visit: Admitting: Family Medicine

## 2024-10-20 VITALS — BP 124/84 | HR 72 | Ht 73.0 in | Wt 317.0 lb

## 2024-10-20 DIAGNOSIS — M109 Gout, unspecified: Secondary | ICD-10-CM

## 2024-10-20 DIAGNOSIS — M0600 Rheumatoid arthritis without rheumatoid factor, unspecified site: Secondary | ICD-10-CM | POA: Diagnosis not present

## 2024-10-20 NOTE — Patient Instructions (Signed)
 Happy Holidays See you again in 6 months

## 2024-10-20 NOTE — Assessment & Plan Note (Signed)
 Much better with the medications.  Patient is also doing well with weight loss.  Down another 5 pounds in the last month.  Increase activity.  Follow-up with me in 6 months

## 2024-10-20 NOTE — Assessment & Plan Note (Signed)
 Significant improvement on the medication.  No other significant changes.  We do not need to recheck the medication or labs at this juncture.

## 2024-10-21 ENCOUNTER — Ambulatory Visit
Admission: EM | Admit: 2024-10-21 | Discharge: 2024-10-21 | Disposition: A | Discharge status: Home / Self Care | Attending: Internal Medicine | Admitting: Internal Medicine

## 2024-10-21 ENCOUNTER — Encounter: Payer: Self-pay | Admitting: *Deleted

## 2024-10-21 DIAGNOSIS — H66002 Acute suppurative otitis media without spontaneous rupture of ear drum, left ear: Secondary | ICD-10-CM

## 2024-10-21 DIAGNOSIS — J029 Acute pharyngitis, unspecified: Secondary | ICD-10-CM

## 2024-10-21 HISTORY — DX: Gout, unspecified: M10.9

## 2024-10-21 LAB — POCT RAPID STREP A (OFFICE): Rapid Strep A Screen: NEGATIVE

## 2024-10-21 MED ORDER — AMOXICILLIN-POT CLAVULANATE 875-125 MG PO TABS: 1.0000 | ORAL_TABLET | Freq: Two times a day (BID) | ORAL | 0 refills | Status: AC

## 2024-10-21 MED ORDER — DEXAMETHASONE SOD PHOSPHATE PF 10 MG/ML IJ SOLN
10.0000 mg | Freq: Once | INTRAMUSCULAR | Status: AC
  Administered 2024-10-21: 10 mg via INTRAMUSCULAR

## 2024-10-21 MED ORDER — KETOROLAC TROMETHAMINE 30 MG/ML IJ SOLN
30.0000 mg | Freq: Once | INTRAMUSCULAR | Status: AC
  Administered 2024-10-21: 30 mg via INTRAMUSCULAR

## 2024-10-21 NOTE — ED Provider Notes (Signed)
 " [facility]    CSN: [id number] Arrival date & time: 10/21/24  1203      History   Chief Complaint Chief Complaint  Patient presents with   Sore Throat    HPI Thomas Schaefer is a 43 y.o. male.   43 year old male who presents urgent care with complaints of a sore throat and difficulty swallowing.  This started about a week and a half ago.  He is also having some pain in the left ear.  He has tried over-the-counter medication without much relief.  He reports that he has had some significant swelling where it felt like it was difficult to swallow so he took 2 doses of Augmentin  which seem to have helped but then he did not have any more to take.  He reports that the swelling is a little bit better this week but he is still having a lot of difficulty swallowing.  He denies any difficulty breathing.   Sore Throat Pertinent negatives include no chest pain, no abdominal pain and no shortness of breath.    Past Medical History:  Diagnosis Date   GERD (gastroesophageal reflux disease)    Gout    Hyperlipidemia    Inflammatory polyarthritis (HCC)     Patient Active Problem List   Diagnosis Date Noted   Piriformis syndrome of left side 05/20/2024   Vitamin D  deficiency 01/30/2024   Impingement of shoulder 06/04/2023   Right ear pain 03/06/2023   High risk medication use 12/31/2022   Dyslipidemia, goal LDL below 100 10/20/2022   Other fatigue 09/03/2022   Abnormal fasting glucose 09/03/2022   Polyarticular gout 03/30/2022   Seronegative rheumatoid arthritis (HCC) 01/15/2022   Body mass index (BMI) of 31.0-31.9 in adult 01/15/2022   BMI 40.0-44.9, adult (HCC) 12/03/2021   Hyperuricemia 08/21/2021   Obesity 07/13/2015   Mood disorder 07/13/2015   Allergic rhinitis 06/06/2007   GERD 06/06/2007   IRRITABLE BOWEL SYNDROME 06/06/2007    Past Surgical History:  Procedure Laterality Date   KNEE SURGERY Right    TOE SURGERY Left    VASECTOMY         Home  Medications    Prior to Admission medications  Medication Sig Start Date End Date Taking? Authorizing Provider  amoxicillin -clavulanate (AUGMENTIN ) 875-125 MG tablet Take 1 tablet by mouth every 12 (twelve) hours for 10 days. 10/21/24 10/31/24 Yes Kindrick Lankford A, PA-C  febuxostat  (ULORIC ) 40 MG tablet Take 1 tablet (40 mg total) by mouth daily. 06/26/24  Yes Claudene Arthea HERO, DO  Upadacitinib  ER (RINVOQ ) 15 MG TB24 Take 1 tablet (15 mg total) by mouth daily. 09/16/24  Yes Rice, Lonni ORN, MD  albuterol  (VENTOLIN  HFA) 108 (90 Base) MCG/ACT inhaler Inhale 2 puffs into the lungs every 4 (four) hours as needed for wheezing or shortness of breath. 03/04/24   Arloa Suzen RAMAN, NP  benzonatate  (TESSALON ) 200 MG capsule Take 1 capsule (200 mg total) by mouth 3 (three) times daily as needed for cough. 02/06/24   Arloa Suzen RAMAN, NP  clindamycin (CLEOCIN T) 1 % external solution Apply topically 2 (two) times daily. 05/20/24   [provider]  colchicine  0.6 MG tablet Take 2 tablets on 1st day then 1 tablet daily for 5 days 04/21/24   Jeannetta Lonni ORN, MD  doxycycline (VIBRAMYCIN) 100 MG capsule Take 100 mg by mouth daily. 05/20/24   [provider]  EPIPEN 2-PAK 0.3 MG/0.3ML SOAJ injection  04/20/15   [provider]  ibuprofen (ADVIL)  200 MG tablet Take 800 mg by mouth as needed.    [provider]  levocetirizine (XYZAL ) 5 MG tablet Take 1 tablet (5 mg total) by mouth every evening. 03/04/24   Arloa Suzen RAMAN, NP  predniSONE  (DELTASONE ) 20 MG tablet Take 1 tablet (20 mg total) by mouth daily with breakfast. 05/20/24   Claudene Arthea HERO, DO  promethazine -dextromethorphan (PROMETHAZINE -DM) 6.25-15 MG/5ML syrup Take 5 mLs by mouth 4 (four) times daily as needed for cough. 03/04/24   Arloa Suzen RAMAN, NP  Vitamin D , Ergocalciferol , (DRISDOL ) 1.25 MG (50000 UNIT) CAPS capsule Take 1 capsule (50,000 Units total) by mouth every 7 (seven) days. 07/23/24   Claudene Arthea HERO, DO     Family History Family History  Problem Relation Age of Onset   Mental illness Mother    Drug abuse Mother    Diabetes Father    Alcohol abuse Father    Stroke Father    Alcohol abuse Maternal Uncle     Social History Social History[1]   Allergies   Bee venom   Review of Systems Review of Systems  Constitutional:  Negative for chills and fever.  HENT:  Positive for sore throat and trouble swallowing. Negative for ear pain.   Eyes:  Negative for pain and visual disturbance.  Respiratory:  Negative for cough and shortness of breath.   Cardiovascular:  Negative for chest pain and palpitations.  Gastrointestinal:  Negative for abdominal pain and vomiting.  Genitourinary:  Negative for dysuria and hematuria.  Musculoskeletal:  Negative for arthralgias and back pain.  Skin:  Negative for color change and rash.  Neurological:  Negative for seizures and syncope.  All other systems reviewed and are negative.    Physical Exam Triage Vital Signs ED Triage Vitals  Encounter Vitals Group     BP 10/21/24 1328 (!) 137/90     Girls Systolic BP Percentile --      Girls Diastolic BP Percentile --      Boys Systolic BP Percentile --      Boys Diastolic BP Percentile --      Pulse Rate 10/21/24 1328 73     Resp 10/21/24 1328 18     Temp 10/21/24 1328 98.5 F (36.9 C)     Temp Source 10/21/24 1328 Oral     SpO2 10/21/24 1328 96 %     Weight --      Height --      Head Circumference --      Peak Flow --      Pain Score 10/21/24 1326 4     Pain Loc --      Pain Education --      Exclude from Growth Chart --    No data found.  Updated Vital Signs BP (!) 137/90 (BP Location: Right Arm)   Pulse 73   Temp 98.5 F (36.9 C) (Oral)   Resp 18   SpO2 96%   Visual Acuity Right Eye Distance:   Left Eye Distance:   Bilateral Distance:    Right Eye Near:   Left Eye Near:    Bilateral Near:     Physical Exam Vitals and nursing note reviewed.  Constitutional:       General: He is not in acute distress.    Appearance: He is well-developed.  HENT:     Head: Normocephalic and atraumatic.     Right Ear: Tympanic membrane normal.     Left Ear: Tympanic membrane is erythematous.  Nose: No congestion.     Mouth/Throat:     Mouth: Mucous membranes are moist.     Pharynx: Uvula midline. Pharyngeal swelling and posterior oropharyngeal erythema present.     Tonsils: 3+ on the right. 3+ on the left.  Eyes:     Conjunctiva/sclera: Conjunctivae normal.  Cardiovascular:     Rate and Rhythm: Normal rate and regular rhythm.     Heart sounds: No murmur heard. Pulmonary:     Effort: Pulmonary effort is normal. No respiratory distress.     Breath sounds: Normal breath sounds.  Abdominal:     Palpations: Abdomen is soft.     Tenderness: There is no abdominal tenderness.  Musculoskeletal:        General: No swelling.     Cervical back: Neck supple.  Skin:    General: Skin is warm and dry.     Capillary Refill: Capillary refill takes less than 2 seconds.  Neurological:     Mental Status: He is alert.  Psychiatric:        Mood and Affect: Mood normal.      UC Treatments / Results  Labs (all labs ordered are listed, but only abnormal results are displayed) Labs Reviewed  POCT RAPID STREP A (OFFICE) - Normal    EKG   Radiology No results found.  Procedures Procedures (including critical care time)  Medications Ordered in UC Medications  ketorolac  (TORADOL ) 30 MG/ML injection 30 mg (has no administration in time range)  dexamethasone  (DECADRON ) injection 10 mg (has no administration in time range)    Initial Impression / Assessment and Plan / UC Course  I have reviewed the triage vital signs and the nursing notes.  Pertinent labs & imaging results that were available during my care of the patient were reviewed by me and considered in my medical decision making (see chart for details).     Acute pharyngitis, unspecified etiology  Sore  throat - Plan: POCT rapid strep A, POCT rapid strep A  Non-recurrent acute suppurative otitis media of left ear without spontaneous rupture of tympanic membrane   Strep testing done today was negative.  Symptoms and physical exam findings are consistent with acute pharyngitis as well as a left otitis media.  Due to the severity of symptoms as well as the swelling present we have done an injection today of a medication for pain as well as a steroid to help with inflammation.  We will treat with antibiotics by mouth.  We recommend the following: Toradol  injection given today. This is a medication to help with pain. This is not a narcotic.  Decadron  injection given today. This is a steroid to help with inflammation and pain. Augmentin  875 mg twice daily for 10 days.  This is an antibiotic.  Take this with food.  Make sure to stay hydrated by drinking plenty of water. Return to urgent care or PCP if symptoms worsen or fail to resolve.    Final Clinical Impressions(s) / UC Diagnoses   Final diagnoses:  Sore throat  Acute pharyngitis, unspecified etiology  Non-recurrent acute suppurative otitis media of left ear without spontaneous rupture of tympanic membrane     Discharge Instructions      Strep testing done today was negative.  Symptoms and physical exam findings are consistent with acute pharyngitis as well as a left otitis media.  Due to the severity of symptoms as well as the swelling present we have done an injection today of a medication for pain  as well as a steroid to help with inflammation.  We will treat with antibiotics by mouth.  We recommend the following: Toradol  injection given today. This is a medication to help with pain. This is not a narcotic.  Decadron  injection given today. This is a steroid to help with inflammation and pain. Augmentin  875 mg twice daily for 10 days.  This is an antibiotic.  Take this with food.  Make sure to stay hydrated by drinking plenty of  water. Return to urgent care or PCP if symptoms worsen or fail to resolve.       ED Prescriptions     Medication Sig Dispense Auth. Provider   amoxicillin -clavulanate (AUGMENTIN ) 875-125 MG tablet Take 1 tablet by mouth every 12 (twelve) hours for 10 days. 20 tablet Teresa Almarie LABOR, [place]      PDMP not reviewed this encounter.    [1]  Social History Tobacco Use   Smoking status: Former    Current packs/day: 0.00    Average packs/day: 1 pack/day for 11.0 years (11.0 ttl pk-yrs)    Types: Cigarettes    Start date: 60    Quit date: 2005    Years since quitting: 20.9    Passive exposure: Past   Smokeless tobacco: Never  Vaping Use   Vaping status: Never Used  Substance Use Topics   Alcohol use: Not Currently    Comment: rarely   Drug use: No     Teresa Almarie LABOR, PA-C 10/21/24 1401  "

## 2024-10-21 NOTE — ED Triage Notes (Signed)
 Pt reports throat swollen 4 days ago. He took leftover amoxicillin  for 2 days. States throat hurts when he swallows. Denies fever/denies cough. Concerned for infection

## 2024-10-21 NOTE — Discharge Instructions (Addendum)
 Strep testing done today was negative.  Symptoms and physical exam findings are consistent with acute pharyngitis as well as a left otitis media.  Due to the severity of symptoms as well as the swelling present we have done an injection today of a medication for pain as well as a steroid to help with inflammation.  We will treat with antibiotics by mouth.  We recommend the following: Toradol  injection given today. This is a medication to help with pain. This is not a narcotic.  Decadron  injection given today. This is a steroid to help with inflammation and pain. Augmentin  875 mg twice daily for 10 days.  This is an antibiotic.  Take this with food.  Make sure to stay hydrated by drinking plenty of water. Return to urgent care or PCP if symptoms worsen or fail to resolve.

## 2024-11-06 ENCOUNTER — Other Ambulatory Visit: Payer: Self-pay

## 2024-11-09 ENCOUNTER — Other Ambulatory Visit: Payer: Self-pay

## 2024-11-11 ENCOUNTER — Other Ambulatory Visit (HOSPITAL_COMMUNITY): Payer: Self-pay

## 2024-11-13 ENCOUNTER — Other Ambulatory Visit: Payer: Self-pay | Admitting: Pharmacy Technician

## 2024-11-13 ENCOUNTER — Other Ambulatory Visit: Payer: Self-pay

## 2024-11-13 NOTE — Progress Notes (Signed)
 Specialty Pharmacy Refill Coordination Note  Thomas Schaefer is a 44 y.o. male contacted today regarding refills of specialty medication(s) Upadacitinib  (Rinvoq )   Patient requested Delivery   Delivery date: 11/19/24   Verified address: [address] [place]   Medication will be filled on: 11/18/24

## 2024-11-16 ENCOUNTER — Other Ambulatory Visit: Payer: Self-pay

## 2024-11-18 ENCOUNTER — Other Ambulatory Visit: Payer: Self-pay

## 2024-11-18 NOTE — Assessment & Plan Note (Addendum)
 SABRA

## 2024-11-18 NOTE — Progress Notes (Unsigned)
 "  Office Visit Note  Patient: Thomas Schaefer             Date of Birth: [birth year]           MRN: [id number]             PCP: Chandra Toribio POUR, MD Referring: Chandra Toribio POUR, MD Visit Date: 12/01/2024   Subjective:  No chief complaint on file.   History of Present Illness: Thomas Schaefer is a 44 y.o. male here for follow up for seropositive RA on Rinvoq  15 mg p.o. daily.      Discussed the use of AI scribe software for clinical note transcription with the patient, who gave verbal consent to proceed.  History of Present Illness   Thomas Schaefer is a 44 year old male with rheumatoid arthritis and gout who presents for a follow-up visit.  He is currently on Rinvoq  15 mg daily and Uloric  40 mg daily for the management of his rheumatoid arthritis and gout. He occasionally forgets to take his Uloric  at night, leading to morning stiffness and pain in his hands, similar to symptoms experienced prior to treatment. These symptoms resolve after taking the missed dose in the morning. He sometimes takes the missed dose in the morning and then resumes his regular dosing schedule at night, effectively taking two doses in one day.  No recent major flare-ups of his conditions and has not required colchicine , ibuprofen, Aleve, or steroids such as prednisone  since starting his current medication regimen. No recent illnesses requiring antibiotics.  He experiences annual upper respiratory symptoms, including swollen glands and ear pain on swallowing, which he describes as a recurring issue. No new or severe illnesses.  He reports a slight increase in hand swelling, which he attributes to weight gain, noting that his wedding ring feels tighter. He has gained weight, currently weighing 317 pounds, up from 290 pounds. No significant ankle swelling reported.       Previous HPI 04/21/2024 Thomas Schaefer is a 44 y.o. male here for follow up for seropositive RA on Rinvoq  15 mg p.o. daily.     He experiences  ongoing joint pain and stiffness, primarily affecting his wrists, shoulders, ankles, and knees. Rinvoq  has been more effective than previous treatments, particularly in his hands, but significant discomfort persists in his knees, especially at night. He describes a new pain radiating from his back down the side of his leg, suspecting it may be related to his sciatic nerve.   He recently completed a prednisone  taper following bronchitis and an unspecified infection, which temporarily alleviated his joint pain. However, the pain returned shortly after discontinuing prednisone .   He has low vitamin D  levels and is taking supplements. Additionally, he is taking DHEA due to lower than normal testosterone  levels identified in recent blood work.   There is a history of elevated uric acid levels, and he recalls a previous episode of foot pain that responded well to medication, suggesting a possible gout diagnosis. More recently, in sports medicine with Dr. Claudene clinic an ultrasound apparently showed uric acid crystals in his joints. He recalls taking colchicine  in the past, which provided relief during a gout flare-up. He is uncertain about previous use of allopurinol , but notes that his uric acid levels have been high in the past, though they were within normal range during a recent check in April.   Previous HPI 12/10/2023 Thomas Schaefer is a 44 y.o. male here for follow up for seropositive RA on Rinvoq  15 mg  p.o. daily.   He is managing rheumatoid arthritis with Rinvoq  after discontinuing prednisone . Rinvoq  has effectively reduced pain in his hand joints, allowing him to close his fist without pain, though his fingers remain swollen, preventing him from wearing his ring. 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 The pain subsides after the first 10 to 20 steps.   He  experiences sleep disturbances due to shoulder pain and restless legs. He describes numbness and a restless sensation in his shoulders when lying down, necessitating frequent position changes. His restless leg symptoms have worsened, characterized by an uncomfortable sensation that compels movement for relief. He has not previously taken medication for restless legs and denies any history of iron deficiency.   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 The results were normal except for slightly elevated cholesterol levels. No recent illnesses have been reported.      Previous HPI 09/04/2023 Thomas Schaefer is a 44 y.o. male here for follow up for seropositive RA on Rinvoq  15 mg p.o. daily and prednisone  5 mg twice daily.  Since witching to Rinvoq  he is overall mostly doing better.  Most of his joint pain and stiffness outside of the right knee is controlled with just the Rinvoq .  He does notice symptoms creeping in a bit worse by the end of the day.  He decreased the prednisone  now taking just 5 mg once daily if he stops this for 2 or 3 days he redeveloped significant knee swelling.  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   Previous HPI 06/04/2023 Thomas Schaefer is a 43 y.o. male here for follow up for seropositive RA on Enbrel  50 mg subcu weekly and prednisone  5 mg twice daily.  He has tried decreasing prednisone  to only once daily on some days but when stopping completely he quickly saw increase in joint pain and swelling within days.  He has a stable amount of persistent right knee swelling.  Recently had incident with a yellowjacket sting on the left hand this caused a severe amount of swelling that he has previous history of  hypersensitivity reaction to bee venom.  After the blistering at the sting site drained swelling has mostly gone down except in his left index finger.  We spoke on the phone last month due to increasing trouble with pain in his shoulders with some numbness tingling and sometimes painful sensation radiating down the arms.  Especially has trouble with shoulder pain at night if he lies on either side which frequently disrupts his sleep.  Recommended neurology evaluation for nerve conduction study but he missed this appointment due to being sick with COVID and needs to reschedule.   Previous HPI 03/06/2023 Thomas Schaefer is a 44 y.o. male here for follow up for seronegative RA on Enbrel  now for 1 month and on prednisone  5 mg twice daily.  He is not noticing trouble with the Enbrel  but also has not seen a significant improvement in joint symptoms.  Symptoms are mostly controlled with 5 mg twice daily prednisone  still having swelling in his hands and wrists and bilateral shoulder pain.  Shoulder pain is most bothersome at nighttime and frequently awakens him from sleep requiring repositioning.  Also gets pain during the day most with overhead movements.  When decreasing the prednisone  further he notices some right-sided pain at the lower jaw and ear.  He has had pain in this area in multiple occasions on the past without specific cause identified that usually has resolved very quickly with antibiotic treatment.   Previous HPI 01/23/23 Thomas Schaefer is a 44 y.o. male here for follow up for seropositiverheumatoid arthritis on leflunomide  20 mg daily.  Since last visit he had right knee arthroscopic surgery with some debridement of extensive synovial hypertrophy and some loose cartilage bodies.  Afterwards he discontinued prednisone  completely and after about 4 days started feeling generalized worsening of joint pain and stiffness and severe fatigue.  Initially tried managing symptoms with ibuprofen 800 mg after  calling us  I recommended resuming a lower dose prednisone  and gradually tapering it off at 2-week increment.  So he is back to taking 5 mg twice daily right now and ibuprofen 800 mg every 6 hours and has been out of work for the past 2 weeks.  He is not having trouble taking the leflunomide  but has not seen any improvement yet.  His right knee actually feels well and everywhere else is more painful and inflamed.  Has bilateral hand swelling.  Notices weakness inability to grip and lift strongly with his arms.  Also bilateral shoulder pain especially with overhead abduction and lying on his side in bed.   3/11 Right knee surgical pathology Chronically inflamed synovium with degenerative changes   Previous HPI 12/31/22 Thomas Schaefer is a 44 y.o. male here for follow up for RA and gout currently on prednisone  10 mg daily.  Right now he is in a symptom exacerbation doing much worse had a flareup after an exercise training session last week.  He is having pain in his right knee which has been the main ongoing issue also having more trouble with his shoulders right now with pain worse posteriorly especially feels increased pain when lying on his side in bed.  He has had some continued weight gain with the long-term prednisone  use starting phentermine  for weight loss has not seen any progress so far.  He is scheduled for right knee surgery coming up in 1 week.   Previous HPI 08/27/22 Thomas Schaefer is a 44 y.o. male here for follow up for RA and gout currently on prednisone  10 mg daily. He stopped taking methotrexate  due to running out of prescribed tablets after about 1 month. He saw [facility] for follow up with repeat aspiration and injection in right knee and symptoms stayed better for a month or more before swelling and pain got worse again. No new flare ups anywhere else. He discussed possibility of arthroscopy early next year due to the chronic symptoms.   Previous HPI 06/04/22 Thomas Schaefer is a 44 y.o. male here for follow up joint inflammation. Since our last visit he saw Dr. Kendal and MRI of right knee obtained showing changes of synovitis and probable ruptured baker's cyst. He has had a steroid injection of the left knee and the left leg is doing well. Hand pain and stiffness are also doing very well mostly just has the knee pain. He started methotrexate  since 2 weeks ago tolerating it okay. He decreased prednisone  down to 10 mg once daily from twice for a week and is tolerating this okay his knee swelling increased some  but not very painful.   Previous HPI 03/30/2022 Thomas Schaefer is a 44 y.o. male here for follow up for joint inflammation. Since our last visit he completed the prednisone  his pain was partially improved on this but quickly returned off the medication. Swelling never resolved. He now has increased symptoms involving his hands with decreased grip strength and mobility. He notices increasing pain in left knee and right ankle he thinks is compensating for his worse affected joints.   Previous HPI 03/14/2022 Thomas Schaefer is a 44 y.o. male here for follow up for joint inflammation.  Shortly after completing most recent steroids he has redeveloped increased swelling of the right knee and swelling and pain in the left ankle.  He noticed the most recent steroid treatment improved his swelling but the left ankle pain was not dramatically improved. So far he has not seen an obvious difference in swelling while taking the sulfasalazine .   Previous HPI 02/27/2022 Thomas Schaefer is a 44 y.o. male here for follow up for joint inflammation. His knee improved greatly after aspiration and with prednisone  taper but since 4 days ago left ankle pain and inflammation started. Pain is worst first thing in the morning and when standing up gets partially better when up and walking around. He is not taking anything for this currently    Previous HPI 02/07/22 Thomas Schaefer is a 44  y.o. male here for follow up for joint inflammation previously seen in October initial plan to treat gouty arthritis not clear if also having RA with a positive RF. He has suffered multiple and persistent joint inflammation treated with colchicine  and multiple prednisone  courses. He started taking allopurinol  last month and within days developed severe pain and stiffness in his right knee and some pain extending up to the right hip posteriorly. Pain is intermittent and stabbing, and he cannot fully extend his knee. Symptoms are not similar to anything that he had in the past. He took 2 12 day prednisone  tapers these improved symptoms but came back again once down to about 20 mg daily dose. He stopped taking colchicine  in accordance with directions when prescribed the prednisone .   Previous HPI 08/21/21 Thomas Schaefer is a 44 y.o. male here for foot pain and inflammation with elevated rheumatoid factor and uric acid.  Symptoms started since around July of this year with pain on the right foot worse at the bottom of the foot and the smaller for toes. Initial treatment with colchicine  had partial benefit in symptoms.  There is also considerable initial swelling.  He received a short course of oral steroids with more symptom improvement but this persisted. He saw Dr. Verta in September treatment with local steroid infection and oral prednisone  taper and blood tests were checked.  Inflammatory markers were normal but rheumatoid factor and uric acid were significantly elevated.  Xray of the foot has shown soft tissue edema without significant osseus abnormalities.  After the injection and additional medication symptoms again improved at this point he is having persistent mild daily symptoms with some pain worsened with pressure and weight on the ball of his foot without much active swelling. He has had some chronic pain and instability of the right knee for a much longer time without any identified problem, xrays  have been normal.  He does not drink much alcohol regularly.  He used to drink sodas very heavily but has already decreased this to regular but small amounts before any onset of the symptoms.   Labs reviewed  06/2021 ANA neg RF 125 Uric acid 11.1 ESR 6 CRP 3.0  Review of Systems  Constitutional:  Positive for fatigue.  HENT:  Positive for mouth dryness. Negative for mouth sores.   Eyes:  Negative for dryness.  Respiratory:  Negative for shortness of breath.   Cardiovascular:  Negative for chest pain and palpitations.  Gastrointestinal:  Negative for blood in stool, constipation and diarrhea.  Endocrine: Negative for increased urination.  Genitourinary:  Negative for involuntary urination.  Musculoskeletal:  Positive for myalgias and myalgias. Negative for joint pain, gait problem, joint pain, joint swelling, muscle weakness, morning stiffness and muscle tenderness.  Skin:  Negative for color change, rash, hair loss and sensitivity to sunlight.  Allergic/Immunologic: Negative for susceptible to infections.  Neurological:  Negative for dizziness and headaches.  Hematological:  Negative for swollen glands.  Psychiatric/Behavioral:  Negative for depressed mood and sleep disturbance. The patient is not nervous/anxious.     PMFS History:  Patient Active Problem List   Diagnosis Date Noted   Piriformis syndrome of left side 05/20/2024   Vitamin D  deficiency 01/30/2024   Impingement of shoulder 06/04/2023   Right ear pain 03/06/2023   High risk medication use 12/31/2022   Dyslipidemia, goal LDL below 100 10/20/2022   Other fatigue 09/03/2022   Abnormal fasting glucose 09/03/2022   Polyarticular gout 03/30/2022   Seronegative rheumatoid arthritis (HCC) 01/15/2022   Body mass index (BMI) of 31.0-31.9 in adult 01/15/2022   BMI 40.0-44.9, adult (HCC) 12/03/2021   Hyperuricemia 08/21/2021   Obesity 07/13/2015   Mood disorder 07/13/2015   Allergic rhinitis 06/06/2007   GERD 06/06/2007    IRRITABLE BOWEL SYNDROME 06/06/2007    Past Medical History:  Diagnosis Date   GERD (gastroesophageal reflux disease)    Gout    Hyperlipidemia    Inflammatory polyarthritis (HCC)     Family History  Problem Relation Age of Onset   Mental illness Mother    Drug abuse Mother    Diabetes Father    Alcohol abuse Father    Stroke Father    Alcohol abuse Maternal Uncle    Past Surgical History:  Procedure Laterality Date   KNEE SURGERY Right    TOE SURGERY Left    VASECTOMY     Social History   Social History Narrative   Not on file   Immunization History  Administered Date(s) Administered   Moderna Sars-Covid-2 Vaccination 11/04/2019, 12/03/2019     Objective: Vital Signs: BP 102/68   Pulse 70   Temp 97.8 F (36.6 C)   Resp 16   Ht 6' 1 (1.854 m)   Wt (!) 317 lb (143.8 kg)   BMI 41.82 kg/m    Physical Exam   Musculoskeletal Exam: ***   Investigation: No additional findings.  Imaging: No results found.  Recent Labs: Lab Results  Component Value Date   WBC 5.1 09/16/2024   HGB 14.6 09/16/2024   PLT 232 09/16/2024   NA 141 09/16/2024   K 4.1 09/16/2024   CL 105 09/16/2024   CO2 28 09/16/2024   GLUCOSE 87 09/16/2024   BUN 9 09/16/2024   CREATININE 1.11 09/16/2024   BILITOT 0.6 09/16/2024   ALKPHOS 79 09/03/2022   AST 34 09/16/2024   ALT 39 09/16/2024   PROT 6.5 09/16/2024   ALBUMIN 3.8 (L) 09/03/2022   CALCIUM 8.8 09/16/2024   GFRAA >60 06/24/2017   QFTBGOLDPLUS NEGATIVE 09/16/2024    Speciality Comments: Patient had monitoring labs in 11/2023, media in [place]  message  Procedures:  No procedures performed Allergies: Bee venom   Assessment / Plan:     Visit Diagnoses:  Assessment & Plan Seronegative rheumatoid arthritis (HCC)     High risk medication use     Impingement of shoulder      ***  Assessment and Plan    Seronegative rheumatoid arthritis Well-controlled with Rinvoq  15 mg daily. Occasional stiffness when  medication is missed. Trace edema likely due to weight gain and fluid retention. - Continue Rinvoq  15 mg daily. - Monitor for changes in swelling or joint symptoms. - Discussed weight management options, including GLP-1 agonists, with primary care provider.  Gout Well-managed with Uloric  40 mg daily. Occasional stiffness when medication is missed. - Continue Uloric  40 mg daily.  High risk medication use No recent use of high-risk medications. Normal blood tests in November indicating stable kidney function. - Rechecked blood tests today for monitoring medication effects. - Continue to monitor for any signs of fluid retention or kidney function changes.        Follow-Up Instructions: No follow-ups on file.   Lonni LELON Ester, MD  Note - This record has been created using [employer].  Chart creation errors have been sought, but may not always  have been located. Such creation errors do not reflect on  the standard of medical care. "

## 2024-11-25 ENCOUNTER — Other Ambulatory Visit: Payer: Self-pay | Admitting: Family Medicine

## 2024-12-01 ENCOUNTER — Ambulatory Visit: Admitting: Internal Medicine

## 2024-12-01 ENCOUNTER — Encounter: Payer: Self-pay | Admitting: Internal Medicine

## 2024-12-01 VITALS — BP 102/68 | HR 70 | Temp 97.8°F | Resp 16 | Ht 73.0 in | Wt 317.0 lb

## 2024-12-01 DIAGNOSIS — M25819 Other specified joint disorders, unspecified shoulder: Secondary | ICD-10-CM

## 2024-12-01 DIAGNOSIS — M0600 Rheumatoid arthritis without rheumatoid factor, unspecified site: Secondary | ICD-10-CM

## 2024-12-01 DIAGNOSIS — Z79899 Other long term (current) drug therapy: Secondary | ICD-10-CM

## 2025-03-01 ENCOUNTER — Ambulatory Visit: Admitting: Internal Medicine

## 2025-04-20 ENCOUNTER — Ambulatory Visit: Admitting: Family Medicine
